# Patient Record
Sex: Female | Born: 1995 | Race: White | Hispanic: No | Smoking: Former smoker
Health system: Southern US, Community
[De-identification: ages and names within clinical notes are randomized; demographics above are authoritative.]

## PROBLEM LIST (undated history)

## (undated) DIAGNOSIS — Z8049 Family history of malignant neoplasm of other genital organs: Secondary | ICD-10-CM

## (undated) DIAGNOSIS — E785 Hyperlipidemia, unspecified: Secondary | ICD-10-CM

## (undated) DIAGNOSIS — F419 Anxiety disorder, unspecified: Secondary | ICD-10-CM

## (undated) DIAGNOSIS — D649 Anemia, unspecified: Secondary | ICD-10-CM

## (undated) DIAGNOSIS — N946 Dysmenorrhea, unspecified: Secondary | ICD-10-CM

## (undated) DIAGNOSIS — F32A Depression, unspecified: Secondary | ICD-10-CM

## (undated) DIAGNOSIS — B009 Herpesviral infection, unspecified: Secondary | ICD-10-CM

## (undated) DIAGNOSIS — Z803 Family history of malignant neoplasm of breast: Secondary | ICD-10-CM

## (undated) HISTORY — DX: Dysmenorrhea, unspecified: N94.6

## (undated) HISTORY — DX: Depression, unspecified: F32.A

## (undated) HISTORY — DX: Family history of malignant neoplasm of other genital organs: Z80.49

## (undated) HISTORY — DX: Herpesviral infection, unspecified: B00.9

## (undated) HISTORY — PX: TYMPANOSTOMY TUBE PLACEMENT: SHX32

## (undated) HISTORY — DX: Family history of malignant neoplasm of breast: Z80.3

## (undated) HISTORY — DX: Anemia, unspecified: D64.9

## (undated) HISTORY — DX: Hyperlipidemia, unspecified: E78.5

## (undated) HISTORY — DX: Anxiety disorder, unspecified: F41.9

---
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
## 2013-01-02 ENCOUNTER — Ambulatory Visit: Payer: Managed Care, Other (non HMO) | Admitting: Gynecology

## 2013-01-06 ENCOUNTER — Encounter: Payer: Self-pay | Admitting: Gynecology

## 2013-01-06 ENCOUNTER — Ambulatory Visit (INDEPENDENT_AMBULATORY_CARE_PROVIDER_SITE_OTHER): Payer: Managed Care, Other (non HMO) | Admitting: Gynecology

## 2013-01-06 VITALS — BP 100/66 | HR 78 | Resp 12 | Ht 68.5 in | Wt 180.0 lb

## 2013-01-06 DIAGNOSIS — N926 Irregular menstruation, unspecified: Secondary | ICD-10-CM

## 2013-01-06 DIAGNOSIS — Z309 Encounter for contraceptive management, unspecified: Secondary | ICD-10-CM

## 2013-01-06 NOTE — Progress Notes (Signed)
Subjective:     Patient ID: Karen Goodman, female   DOB: 13-Aug-1995, 17 y.o.   MRN: [id number]  HPI Comments: Pt here to follow up contraception using Lomedia-first 2 cycles very light, cramping better but still with some nausea, might be getting better.  Last cycle came on active pills mid-cycle. Started on time, continued to take the pack, no cycle on sugar pill week.  Pt did not do pregnancy test.  Denies any other medications, did not miss or was late, this cycle lasted 4-5d compared with 3d on other cycles.   No mastalgia.    Review of Systems  Genitourinary: Positive for vaginal bleeding (as above). Negative for vaginal pain and pelvic pain.  All other systems reviewed and are negative.       Objective:   Physical Exam  Nursing note and vitals reviewed. Constitutional: She appears well-developed and well-nourished.  Neurological: She is alert.       Assessment:     Dysmenorrhea  contraceptive management     Plan:     Will check qual hcg Pt would like to continue of current ocp if preg test negative, asked to call back after 2 mor cycles if bleeding remains irregular, will change to higher estrogen

## 2013-01-07 LAB — HCG, QUANTITATIVE, PREGNANCY: hCG, Beta Chain, Quant, S: <2 m[IU]/mL

## 2013-03-13 ENCOUNTER — Telehealth: Payer: Self-pay | Admitting: Gynecology

## 2013-03-13 NOTE — Telephone Encounter (Signed)
Spoke with pt who is on Lomedia 24 FE. Pt wanted to update Dr. Farrel Gobble that she is doing fine on med. No BTB or side effects. Cycles regular, 25-28 days long, and they are lighter now.

## 2013-03-13 NOTE — Telephone Encounter (Signed)
Chief Complaint  Patient presents with   Medication Management  pt wanted to give dr lathrop an update on how she is doing on her birth control. Says she is doing fine.

## 2013-03-16 ENCOUNTER — Other Ambulatory Visit: Payer: Self-pay

## 2013-07-26 ENCOUNTER — Telehealth: Payer: Self-pay | Admitting: Gynecology

## 2013-07-26 NOTE — Telephone Encounter (Signed)
Dr. Farrel GobbleLathrop, patient request IUD. Can she schedule or does need an office visit first to discuss?

## 2013-07-26 NOTE — Telephone Encounter (Signed)
Patient calling to wanting to schedule IUD placement with Dr. Farrel GobbleLathrop. She says she has been thinking about it and wants to proceed.  Cc: Karen Goodman

## 2013-07-27 NOTE — Telephone Encounter (Signed)
Patient is calling again about the IUD

## 2013-07-27 NOTE — Telephone Encounter (Signed)
Spoke with patient. Patient agreeable to schedule appointment to discuss IUD placement with Dr.Lathrop. Appointment scheduled with Dr.Lathrop for 3/30 at 1830 to discuss IUD placement.  Routing to provider for final review. Patient agreeable to disposition. Will close encounter

## 2013-07-28 NOTE — Telephone Encounter (Signed)
Insurance information currently on file for this patient is no longer valid.

## 2013-08-07 ENCOUNTER — Encounter: Payer: Self-pay | Admitting: Gynecology

## 2013-08-07 ENCOUNTER — Ambulatory Visit (INDEPENDENT_AMBULATORY_CARE_PROVIDER_SITE_OTHER): Payer: 59 | Admitting: Gynecology

## 2013-08-07 VITALS — BP 124/76 | HR 72 | Resp 16 | Ht 68.5 in | Wt 206.0 lb

## 2013-08-07 DIAGNOSIS — R635 Abnormal weight gain: Secondary | ICD-10-CM

## 2013-08-07 DIAGNOSIS — Z309 Encounter for contraceptive management, unspecified: Secondary | ICD-10-CM

## 2013-08-07 NOTE — Patient Instructions (Signed)
Etonogestrel implant What is this medicine? ETONOGESTREL (et oh noe JES trel) is a contraceptive (birth control) device. It is used to prevent pregnancy. It can be used for up to 3 years. This medicine may be used for other purposes; ask your health care provider or pharmacist if you have questions. COMMON BRAND NAME(S): Implanon, Nexplanon  What should I tell my health care provider before I take this medicine? They need to know if you have any of these conditions: -abnormal vaginal bleeding -blood vessel disease or blood clots -cancer of the breast, cervix, or liver -depression -diabetes -gallbladder disease -headaches -heart disease or recent heart attack -high blood pressure -high cholesterol -kidney disease -liver disease -renal disease -seizures -tobacco smoker -an unusual or allergic reaction to etonogestrel, other hormones, anesthetics or antiseptics, medicines, foods, dyes, or preservatives -pregnant or trying to get pregnant -breast-feeding How should I use this medicine? This device is inserted just under the skin on the inner side of your upper arm by a health care professional. Talk to your pediatrician regarding the use of this medicine in children. Special care may be needed. Overdosage: If you think you've taken too much of this medicine contact a poison control center or emergency room at once. Overdosage: If you think you have taken too much of this medicine contact a poison control center or emergency room at once. NOTE: This medicine is only for you. Do not share this medicine with others. What if I miss a dose? This does not apply. What may interact with this medicine? Do not take this medicine with any of the following medications: -amprenavir -bosentan -fosamprenavir This medicine may also interact with the following medications: -barbiturate medicines for inducing sleep or treating seizures -certain medicines for fungal infections like ketoconazole and  itraconazole -griseofulvin -medicines to treat seizures like carbamazepine, felbamate, oxcarbazepine, phenytoin, topiramate -modafinil -phenylbutazone -rifampin -some medicines to treat HIV infection like atazanavir, indinavir, lopinavir, nelfinavir, tipranavir, ritonavir -St. John's wort This list may not describe all possible interactions. Give your health care provider a list of all the medicines, herbs, non-prescription drugs, or dietary supplements you use. Also tell them if you smoke, drink alcohol, or use illegal drugs. Some items may interact with your medicine. What should I watch for while using this medicine? This product does not protect you against HIV infection (AIDS) or other sexually transmitted diseases. You should be able to feel the implant by pressing your fingertips over the skin where it was inserted. Tell your doctor if you cannot feel the implant. What side effects may I notice from receiving this medicine? Side effects that you should report to your doctor or health care professional as soon as possible: -allergic reactions like skin rash, itching or hives, swelling of the face, lips, or tongue -breast lumps -changes in vision -confusion, trouble speaking or understanding -dark urine -depressed mood -general ill feeling or flu-like symptoms -light-colored stools -loss of appetite, nausea -right upper belly pain -severe headaches -severe pain, swelling, or tenderness in the abdomen -shortness of breath, chest pain, swelling in a leg -signs of pregnancy -sudden numbness or weakness of the face, arm or leg -trouble walking, dizziness, loss of balance or coordination -unusual vaginal bleeding, discharge -unusually weak or tired -yellowing of the eyes or skin Side effects that usually do not require medical attention (Report these to your doctor or health care professional if they continue or are bothersome.): -acne -breast pain -changes in  weight -cough -fever or chills -headache -irregular menstrual bleeding -itching, burning,   and vaginal discharge -pain or difficulty passing urine -sore throat This list may not describe all possible side effects. Call your doctor for medical advice about side effects. You may report side effects to FDA at 1-800-FDA-1088. Where should I keep my medicine? This drug is given in a hospital or clinic and will not be stored at home. NOTE: This sheet is a summary. It may not cover all possible information. If you have questions about this medicine, talk to your doctor, pharmacist, or health care provider.  2014, Elsevier/Gold Standard. (2011-11-02 15:37:45) Levonorgestrel intrauterine device (IUD) What is this medicine? LEVONORGESTREL IUD (LEE voe nor jes trel) is a contraceptive (birth control) device. The device is placed inside the uterus by a healthcare professional. It is used to prevent pregnancy and can also be used to treat heavy bleeding that occurs during your period. Depending on the device, it can be used for 3 to 5 years. This medicine may be used for other purposes; ask your health care provider or pharmacist if you have questions. COMMON BRAND NAME(S): Mirena, Skyla What should I tell my health care provider before I take this medicine? They need to know if you have any of these conditions: -abnormal Pap smear -cancer of the breast, uterus, or cervix -diabetes -endometritis -genital or pelvic infection now or in the past -have more than one sexual partner or your partner has more than one partner -heart disease -history of an ectopic or tubal pregnancy -immune system problems -IUD in place -liver disease or tumor -problems with blood clots or take blood-thinners -use intravenous drugs -uterus of unusual shape -vaginal bleeding that has not been explained -an unusual or allergic reaction to levonorgestrel, other hormones, silicone, or polyethylene, medicines, foods, dyes,  or preservatives -pregnant or trying to get pregnant -breast-feeding How should I use this medicine? This device is placed inside the uterus by a health care professional. Talk to your pediatrician regarding the use of this medicine in children. Special care may be needed. Overdosage: If you think you have taken too much of this medicine contact a poison control center or emergency room at once. NOTE: This medicine is only for you. Do not share this medicine with others. What if I miss a dose? This does not apply. What may interact with this medicine? Do not take this medicine with any of the following medications: -amprenavir -bosentan -fosamprenavir This medicine may also interact with the following medications: -aprepitant -barbiturate medicines for inducing sleep or treating seizures -bexarotene -griseofulvin -medicines to treat seizures like carbamazepine, ethotoin, felbamate, oxcarbazepine, phenytoin, topiramate -modafinil -pioglitazone -rifabutin -rifampin -rifapentine -some medicines to treat HIV infection like atazanavir, indinavir, lopinavir, nelfinavir, tipranavir, ritonavir -St. John's wort -warfarin This list may not describe all possible interactions. Give your health care provider a list of all the medicines, herbs, non-prescription drugs, or dietary supplements you use. Also tell them if you smoke, drink alcohol, or use illegal drugs. Some items may interact with your medicine. What should I watch for while using this medicine? Visit your doctor or health care professional for regular check ups. See your doctor if you or your partner has sexual contact with others, becomes HIV positive, or gets a sexual transmitted disease. This product does not protect you against HIV infection (AIDS) or other sexually transmitted diseases. You can check the placement of the IUD yourself by reaching up to the top of your vagina with clean fingers to feel the threads. Do not pull on  the threads. It is a good   habit to check placement after each menstrual period. Call your doctor right away if you feel more of the IUD than just the threads or if you cannot feel the threads at all. The IUD may come out by itself. You may become pregnant if the device comes out. If you notice that the IUD has come out use a backup birth control method like condoms and call your health care provider. Using tampons will not change the position of the IUD and are okay to use during your period. What side effects may I notice from receiving this medicine? Side effects that you should report to your doctor or health care professional as soon as possible: -allergic reactions like skin rash, itching or hives, swelling of the face, lips, or tongue -fever, flu-like symptoms -genital sores -high blood pressure -no menstrual period for 6 weeks during use -pain, swelling, warmth in the leg -pelvic pain or tenderness -severe or sudden headache -signs of pregnancy -stomach cramping -sudden shortness of breath -trouble with balance, talking, or walking -unusual vaginal bleeding, discharge -yellowing of the eyes or skin Side effects that usually do not require medical attention (report to your doctor or health care professional if they continue or are bothersome): -acne -breast pain -change in sex drive or performance -changes in weight -cramping, dizziness, or faintness while the device is being inserted -headache -irregular menstrual bleeding within first 3 to 6 months of use -nausea This list may not describe all possible side effects. Call your doctor for medical advice about side effects. You may report side effects to FDA at 1-800-FDA-1088. Where should I keep my medicine? This does not apply. NOTE: This sheet is a summary. It may not cover all possible information. If you have questions about this medicine, talk to your doctor, pharmacist, or health care provider.  2014, Elsevier/Gold  Standard. (2011-05-28 13:54:04)  

## 2013-08-07 NOTE — Progress Notes (Addendum)
Pt here to discuss alternative contraceptive options.  Pt has been on LoMedia since 5/14, reports that her cycles have been light and regular but states that she has gained 40#on it.  Pt states that she has not had a change in diet or activity and she was interested in different contraceptive options.   Pt had negative GC/CTM in 5/14 but has had several partners since then and has not been consistent with condom use. Pt has not had sex in over 704m We discussed her options.   We reviewed the risks and benefits on f both the skyla IUD and nexplanon.  Risks of bleeding, infection and uterine perforation we discussed.  Risks of pelvic infection that may result in infertility with history of multiple partners reviewed.  Bleeding profile of both reviewed. MOA for PID reviewed Suggest she return for STD screening and cultures and she is agreeable-lab is closed. Information given  We will check TSH as well as amount of weight is unusual.  Pt will stay on her ocp until contraceptive choice made. Questions addressed. 3792m spent discussing contraceptive choices, >50% face to face  08/08/13- Pt here for pelvic exam and labs.   Pelvic: External genitalia:  no lesions              Urethra:  normal appearing urethra with no masses, tenderness or lesions              Bartholins and Skenes: normal                 Vagina: normal appearing vagina with normal color and discharge, no lesions              Cervix: normal appearance, friable with GC/CTM cultures                 Bimanual Exam:  Uterus:  uterus is normal size, shape, consistency and nontender                                      Adnexa: normal adnexa in size, nontender and no masses                                        GC/CTM, HIV,RPR AND HCV today Will contact with results

## 2013-08-08 ENCOUNTER — Other Ambulatory Visit: Payer: 59

## 2013-08-08 ENCOUNTER — Ambulatory Visit (INDEPENDENT_AMBULATORY_CARE_PROVIDER_SITE_OTHER): Payer: 59 | Admitting: Gynecology

## 2013-08-08 VITALS — BP 118/76 | Resp 20 | Ht 69.5 in | Wt 205.0 lb

## 2013-08-08 DIAGNOSIS — R635 Abnormal weight gain: Secondary | ICD-10-CM

## 2013-08-08 DIAGNOSIS — Z309 Encounter for contraceptive management, unspecified: Secondary | ICD-10-CM

## 2013-08-08 LAB — HIV ANTIBODY (ROUTINE TESTING W REFLEX): HIV: NONREACTIVE

## 2013-08-08 LAB — TSH: TSH: 0.561 u[IU]/mL (ref 0.350–4.500)

## 2013-08-08 LAB — RPR

## 2013-08-08 NOTE — Addendum Note (Signed)
Addended by: Douglass RiversLATHROP, Lorenza Shakir on: 08/08/2013 02:25 PM   Modules accepted: Level of Service

## 2013-08-09 NOTE — Addendum Note (Signed)
Addended by: Douglass RiversLATHROP, Hilari Wethington on: 08/09/2013 02:14 PM   Modules accepted: Orders

## 2013-08-09 NOTE — Progress Notes (Signed)
Labs only see visit 3/30

## 2013-08-11 LAB — IPS N GONORRHOEA AND CHLAMYDIA BY PCR

## 2013-08-14 ENCOUNTER — Telehealth: Payer: Self-pay | Admitting: *Deleted

## 2013-08-14 NOTE — Telephone Encounter (Signed)
Left Message To Call Back  

## 2013-08-14 NOTE — Telephone Encounter (Signed)
Message copied by Lorraine LaxSHAW, Cina Klumpp J on Mon Aug 14, 2013 12:27 PM ------      Message from: Douglass RiversLATHROP, TRACY      Created: Mon Aug 14, 2013 11:20 AM       GC/CTM negative! Sent mychart ------

## 2013-08-14 NOTE — Telephone Encounter (Signed)
Patient returning Jasmine's call.  

## 2013-08-14 NOTE — Telephone Encounter (Signed)
Patient notified see result note 

## 2013-08-15 ENCOUNTER — Telehealth: Payer: Self-pay | Admitting: Gynecology

## 2013-08-15 DIAGNOSIS — Z309 Encounter for contraceptive management, unspecified: Secondary | ICD-10-CM

## 2013-08-15 NOTE — Telephone Encounter (Signed)
Patient is ready to schedule IUD insertion. °

## 2013-08-15 NOTE — Telephone Encounter (Signed)
Spoke with patient. Patient has decided that [facility]. Advised patient that per her insurance benefit quote received, this will be covered at 100% with zero patient liability. Advised patient to call within the first 5 days of cycle to schedule the insertion. Patient agreeable.  French Anaracy, Please enter the iud order. Thanks!!

## 2013-08-15 NOTE — Telephone Encounter (Signed)
IUD order entered. Will schedule when patient calls back on first day of cycle.  Routing to provider for final review. Patient agreeable to disposition. Will close encounter

## 2013-08-22 ENCOUNTER — Telehealth: Payer: Self-pay | Admitting: Gynecology

## 2013-08-22 MED ORDER — MISOPROSTOL 200 MCG PO TABS: ORAL_TABLET | ORAL | Status: DC

## 2013-08-22 NOTE — Telephone Encounter (Signed)
Spoke with patient. Patient states that she started menses today and would like to schedule IUD insertion. Pre procedure instructions given.  Motrin instructions given. Motrin=Advil=Ibuprofen, 800 mg one hour before appointment. Eat a meal and hydrate well before appointment. Take Cytotec 200 mcg tablet. 1 tablet night before the procedure. 1 tablet the morning of the procedure. Patient aware of benefit coverage.Appointment made for tomorrow at 3:30 with Dr.Lathrop.   Routing to provider for final review. Patient agreeable to disposition. Will close encounter

## 2013-08-22 NOTE — Telephone Encounter (Signed)
Pt's period started today and would like to schedule iud procedure.

## 2013-08-23 ENCOUNTER — Ambulatory Visit (INDEPENDENT_AMBULATORY_CARE_PROVIDER_SITE_OTHER): Payer: 59 | Admitting: Gynecology

## 2013-08-23 ENCOUNTER — Encounter: Payer: Self-pay | Admitting: Gynecology

## 2013-08-23 VITALS — BP 108/76 | Resp 12 | Ht 69.5 in | Wt 206.0 lb

## 2013-08-23 DIAGNOSIS — Z30430 Encounter for insertion of intrauterine contraceptive device: Secondary | ICD-10-CM

## 2013-08-23 DIAGNOSIS — Z975 Presence of (intrauterine) contraceptive device: Secondary | ICD-10-CM

## 2013-08-23 DIAGNOSIS — Z309 Encounter for contraceptive management, unspecified: Secondary | ICD-10-CM

## 2013-08-23 NOTE — Patient Instructions (Signed)
IUD PLACEMENT POST PROCEDURE INSTRUCTIONS  1. You may take Ibuprofen, Aleve or Tylenol for pain as needed.      Cramping should resolve within 24 hours.  2. You may have a small amount of spotting. You should wear a mini pad for the next few days  3. You may have intercourse after 24 hours. If you are using this for birth control, it is effective immediately.  4. You need to call if you have any pelvic pain, fever, heavy bleeding or foul smelling vaginal discharge. Irregular bleeding is common the first several months after having an IUD placed. You do not need to for this reason unless you are                     concerned.   5. Shower or bathe as normal  6. You should have a follow-up appointment in 4-8 weeks for a re-check to make sure you are not having any problems.  

## 2013-08-23 NOTE — Progress Notes (Signed)
[age] yrsSingle Caucasian female presents for  insertion of SKYLA. Denies any vaginal symptoms or STD concerns.  LMP 08/22/13  Patient read information regarding IUD insertion.  All questions addressed.    Healthy female,time, place and personnormal menses, no abnormal bleeding, pelvic pain or discharge, no breast pain or new or enlarging lumps on self exam Abdomen: soft, non-tender Groinno inguinal nodes palpated  Pelvic exam: Vulva;normal female genitalia  Vagina:normal vagina  Cervix:Non-tender, Negative CMT, no lesions or redness, nulliparous/parous os  Uterus:normal shape, position and consistency   Lab:no pathogens  Procedure:  Speculum inserted into vagina. Cervix visualized and cleansed with betadine solution X 3. Xylocaine jelly 2% placed anteriorly and endocervix Tenaculum placed on cervix at 12 o'clock position(s).  Uterus sounded to 7 centimeters.  IUD removed from sterile packet and under sterile conditions inserted to fundus of uterus.  Introducer removed without difficulty.  IUD string trimmed to 3 centimeters.  Remainder string given to patient to feel for identification.  Tenaculum removed.  Some bleeding noted.  Speculum removed.  Uterus palpated normal.  Patient tolerated procedure well.  A: Insertion of Lot # [id number], Expiration date 1/17, SKYLA   P:  Instructions and warnings signs given.       IUD identification card given with IUD removal 08/2016       Return visit 47M

## 2013-09-22 ENCOUNTER — Encounter: Payer: Self-pay | Admitting: Gynecology

## 2013-09-22 ENCOUNTER — Ambulatory Visit (INDEPENDENT_AMBULATORY_CARE_PROVIDER_SITE_OTHER): Payer: 59 | Admitting: Gynecology

## 2013-09-22 VITALS — BP 100/60 | HR 88 | Resp 18 | Ht 69.5 in | Wt 202.0 lb

## 2013-09-22 DIAGNOSIS — Z30431 Encounter for routine checking of intrauterine contraceptive device: Secondary | ICD-10-CM

## 2013-09-22 DIAGNOSIS — Z975 Presence of (intrauterine) contraceptive device: Secondary | ICD-10-CM

## 2013-09-22 NOTE — Progress Notes (Signed)
Pt here for 3068m IUD check for skyla, no dyspareunia, no bleeding issues.  Very happy.  Pt was concerned re weight gain, she has lost 33 this month and is feeling less bloated.  BP 100/60  Pulse 88  Resp 18  Ht 5' 9.5" (1.765 m)  Wt 202 lb (91.627 kg)  BMI 29.41 kg/m2  LMP 08/22/2013 General appearance: alert, cooperative and appears stated age  Pelvic: External genitalia:  no lesions              Urethra:  normal appearing urethra with no masses, tenderness or lesions              Bartholins and Skenes: normal                 Vagina: normal appearing vagina with normal color and discharge, no lesions              Cervix: normal appearance, IUD strings noted, no CMT                     Bimanual Exam:  Uterus:  uterus is normal size, shape, consistency and nontender                                      Adnexa: normal adnexa in size, nontender and no masses                                        Assessment: IUD check up  Plan; Doing well F/u prn

## 2013-09-25 ENCOUNTER — Ambulatory Visit: Payer: 59 | Admitting: Gynecology

## 2016-06-22 DIAGNOSIS — G5600 Carpal tunnel syndrome, unspecified upper limb: Secondary | ICD-10-CM | POA: Diagnosis not present

## 2017-09-23 DIAGNOSIS — J0300 Acute streptococcal tonsillitis, unspecified: Secondary | ICD-10-CM | POA: Diagnosis not present

## 2019-10-23 ENCOUNTER — Encounter: Payer: Self-pay | Admitting: Obstetrics and Gynecology

## 2019-10-27 DIAGNOSIS — Z975 Presence of (intrauterine) contraceptive device: Secondary | ICD-10-CM | POA: Diagnosis not present

## 2019-10-27 DIAGNOSIS — N939 Abnormal uterine and vaginal bleeding, unspecified: Secondary | ICD-10-CM | POA: Diagnosis not present

## 2019-11-20 ENCOUNTER — Telehealth: Payer: Self-pay | Admitting: Obstetrics and Gynecology

## 2019-11-20 ENCOUNTER — Ambulatory Visit: Payer: BC Managed Care – PPO | Admitting: Obstetrics and Gynecology

## 2019-11-20 ENCOUNTER — Other Ambulatory Visit (HOSPITAL_COMMUNITY)
Admission: RE | Admit: 2019-11-20 | Discharge: 2019-11-20 | Disposition: A | Discharge status: Home / Self Care | Payer: Self-pay | Source: Ambulatory Visit | Attending: Obstetrics and Gynecology | Admitting: Obstetrics and Gynecology

## 2019-11-20 ENCOUNTER — Other Ambulatory Visit: Payer: Self-pay

## 2019-11-20 ENCOUNTER — Encounter: Payer: Self-pay | Admitting: Obstetrics and Gynecology

## 2019-11-20 ENCOUNTER — Other Ambulatory Visit: Payer: Self-pay | Admitting: Obstetrics and Gynecology

## 2019-11-20 VITALS — BP 110/72 | HR 80 | Resp 14 | Ht 68.0 in | Wt 215.0 lb

## 2019-11-20 DIAGNOSIS — Z113 Encounter for screening for infections with a predominantly sexual mode of transmission: Secondary | ICD-10-CM | POA: Diagnosis not present

## 2019-11-20 DIAGNOSIS — N939 Abnormal uterine and vaginal bleeding, unspecified: Secondary | ICD-10-CM | POA: Diagnosis not present

## 2019-11-20 DIAGNOSIS — Z803 Family history of malignant neoplasm of breast: Secondary | ICD-10-CM

## 2019-11-20 DIAGNOSIS — Z01419 Encounter for gynecological examination (general) (routine) without abnormal findings: Secondary | ICD-10-CM | POA: Diagnosis not present

## 2019-11-20 NOTE — Patient Instructions (Signed)
EXERCISE AND DIET:  We recommended that you start or continue a regular exercise program for good health. Regular exercise means any activity that makes your heart beat faster and makes you sweat.  We recommend exercising at least 30 minutes per day at least 3 days a week, preferably 4 or 5.  We also recommend a diet low in fat and sugar.  Inactivity, poor dietary choices and obesity can cause diabetes, heart attack, stroke, and kidney damage, among others.    ALCOHOL AND SMOKING:  Women should limit their alcohol intake to no more than 7 drinks/beers/glasses of wine (combined, not each!) per week. Moderation of alcohol intake to this level decreases your risk of breast cancer and liver damage. And of course, no recreational drugs are part of a healthy lifestyle.  And absolutely no smoking or even second hand smoke. Most people know smoking can cause heart and lung diseases, but did you know it also contributes to weakening of your bones? Aging of your skin?  Yellowing of your teeth and nails?  CALCIUM AND VITAMIN D:  Adequate intake of calcium and Vitamin D are recommended.  The recommendations for exact amounts of these supplements seem to change often, but generally speaking 600 mg of calcium (either carbonate or citrate) and 800 units of Vitamin D per day seems prudent. Certain women may benefit from higher intake of Vitamin D.  If you are among these women, your doctor will have told you during your visit.    PAP SMEARS:  Pap smears, to check for cervical cancer or precancers,  have traditionally been done yearly, although recent scientific advances have shown that most women can have pap smears less often.  However, every woman still should have a physical exam from her gynecologist every year. It will include a breast check, inspection of the vulva and vagina to check for abnormal growths or skin changes, a visual exam of the cervix, and then an exam to evaluate the size and shape of the uterus and  ovaries.  And after 26 years of age, a rectal exam is indicated to check for rectal cancers. We will also provide age appropriate advice regarding health maintenance, like when you should have certain vaccines, screening for sexually transmitted diseases, bone density testing, colonoscopy, mammograms, etc.   MAMMOGRAMS:  All women over 40 years old should have a yearly mammogram. Many facilities now offer a "3D" mammogram, which may cost around $50 extra out of pocket. If possible,  we recommend you accept the option to have the 3D mammogram performed.  It both reduces the number of women who will be called back for extra views which then turn out to be normal, and it is better than the routine mammogram at detecting truly abnormal areas.    COLONOSCOPY:  Colonoscopy to screen for colon cancer is recommended for all women at age 50.  We know, you hate the idea of the prep.  We agree, BUT, having colon cancer and not knowing it is worse!!  Colon cancer so often starts as a polyp that can be seen and removed at colonscopy, which can quite literally save your life!  And if your first colonoscopy is normal and you have no family history of colon cancer, most women don't have to have it again for 10 years.  Once every ten years, you can do something that may end up saving your life, right?  We will be happy to help you get it scheduled when you are ready.    Be sure to check your insurance coverage so you understand how much it will cost.  It may be covered as a preventative service at no cost, but you should check your particular policy.     Levonorgestrel intrauterine device (IUD) What is this medicine? LEVONORGESTREL IUD (LEE voe nor jes trel) is a contraceptive (birth control) device. The device is placed inside the uterus by a healthcare professional. It is used to prevent pregnancy. This device can also be used to treat heavy bleeding that occurs during your period. This medicine may be used for other  purposes; ask your health care provider or pharmacist if you have questions. COMMON BRAND NAME(S): [name], LILETTA, Mirena, Skyla What should I tell my health care provider before I take this medicine? They need to know if you have any of these conditions:  abnormal Pap smear  cancer of the breast, uterus, or cervix  diabetes  endometritis  genital or pelvic infection now or in the past  have more than one sexual partner or your partner has more than one partner  heart disease  history of an ectopic or tubal pregnancy  immune system problems  IUD in place  liver disease or tumor  problems with blood clots or take blood-thinners  seizures  use intravenous drugs  uterus of unusual shape  vaginal bleeding that has not been explained  an unusual or allergic reaction to levonorgestrel, other hormones, silicone, or polyethylene, medicines, foods, dyes, or preservatives  pregnant or trying to get pregnant  breast-feeding How should I use this medicine? This device is placed inside the uterus by a health care professional. Talk to your pediatrician regarding the use of this medicine in children. Special care may be needed. Overdosage: If you think you have taken too much of this medicine contact a poison control center or emergency room at once. NOTE: This medicine is only for you. Do not share this medicine with others. What if I miss a dose? This does not apply. Depending on the brand of device you have inserted, the device will need to be replaced every 3 to 6 years if you wish to continue using this type of birth control. What may interact with this medicine? Do not take this medicine with any of the following medications:  amprenavir  bosentan  fosamprenavir This medicine may also interact with the following medications:  aprepitant  armodafinil  barbiturate medicines for inducing sleep or treating  seizures  bexarotene  boceprevir  griseofulvin  medicines to treat seizures like carbamazepine, ethotoin, felbamate, oxcarbazepine, phenytoin, topiramate  modafinil  pioglitazone  rifabutin  rifampin  rifapentine  some medicines to treat HIV infection like atazanavir, efavirenz, indinavir, lopinavir, nelfinavir, tipranavir, ritonavir  St. John's wort  warfarin This list may not describe all possible interactions. Give your health care provider a list of all the medicines, herbs, non-prescription drugs, or dietary supplements you use. Also tell them if you smoke, drink alcohol, or use illegal drugs. Some items may interact with your medicine. What should I watch for while using this medicine? Visit your doctor or health care professional for regular check ups. See your doctor if you or your partner has sexual contact with others, becomes HIV positive, or gets a sexual transmitted disease. This product does not protect you against HIV infection (AIDS) or other sexually transmitted diseases. You can check the placement of the IUD yourself by reaching up to the top of your vagina with clean fingers to feel the threads. Do not pull   on the threads. It is a good habit to check placement after each menstrual period. Call your doctor right away if you feel more of the IUD than just the threads or if you cannot feel the threads at all. The IUD may come out by itself. You may become pregnant if the device comes out. If you notice that the IUD has come out use a backup birth control method like condoms and call your health care provider. Using tampons will not change the position of the IUD and are okay to use during your period. This IUD can be safely scanned with magnetic resonance imaging (MRI) only under specific conditions. Before you have an MRI, tell your healthcare provider that you have an IUD in place, and which type of IUD you have in place. What side effects may I notice from  receiving this medicine? Side effects that you should report to your doctor or health care professional as soon as possible:  allergic reactions like skin rash, itching or hives, swelling of the face, lips, or tongue  fever, flu-like symptoms  genital sores  high blood pressure  no menstrual period for 6 weeks during use  pain, swelling, warmth in the leg  pelvic pain or tenderness  severe or sudden headache  signs of pregnancy  stomach cramping  sudden shortness of breath  trouble with balance, talking, or walking  unusual vaginal bleeding, discharge  yellowing of the eyes or skin Side effects that usually do not require medical attention (report to your doctor or health care professional if they continue or are bothersome):  acne  breast pain  change in sex drive or performance  changes in weight  cramping, dizziness, or faintness while the device is being inserted  headache  irregular menstrual bleeding within first 3 to 6 months of use  nausea This list may not describe all possible side effects. Call your doctor for medical advice about side effects. You may report side effects to FDA at 1-800-FDA-1088. Where should I keep my medicine? This does not apply. NOTE: This sheet is a summary. It may not cover all possible information. If you have questions about this medicine, talk to your doctor, pharmacist, or health care provider.  2020 Elsevier/Gold Standard (2018-03-08 13:22:01)  

## 2019-11-20 NOTE — Progress Notes (Signed)
24 y.o. G0P0000 Single Caucasian female here for annual exam.    Patient with Christean Grief IUD and had been having bleeding since 10-23-19. She has not been having cycles with IUD since insertion until now. June 16, she woke up with heavy bleeding, which lasted 2 days. No pain.  States she feels ok.  She had a pregnancy test on 10/29/19, and she states it was negative.  She had a gonorrhea and chlamydia testing which were negative.   Would like another progesterone IUD.   Working at [employer] in [place] since Sept, 2020.  Has been vaccinated against Covid.   Has some anxiety with office visits.  PCP:  Deboraha Sprang  Patient's last menstrual period was 10/23/2019 (exact date).     Period Cycle (Days):  (Skyla IUD)     Sexually active: Yes.    The current method of family planning is IUD--Skyla placed 08/23/13. Exercising: No.  The patient does not participate in regular exercise at present. Smoker:  Yes, smokes 1/4ppd  Health Maintenance: Pap: ?Never History of abnormal Pap:  N/an/a MMG:  N/a--mom dx'd with Breast ca age 43 y.o. Colonoscopy:  n/a BMD:   n/a  Result  n/a TDaP:  n/a Gardasil:   yes HIV:09-20-12 NR Hep C:09-20-12 Neg Screening Labs:  ---   reports that she has been smoking cigarettes. She has a 1.00 pack-year smoking history. She uses smokeless tobacco. She reports current alcohol use of about 21.0 standard drinks of alcohol per week. She reports current drug use. Drug: Marijuana.  Past Medical History:  Diagnosis Date   Anemia    Anxiety    Tx'd at age 30 and gradually weaned off med   Depression    tx'd at age 58 and eventually weaned off medication   Dysmenorrhea     Past Surgical History:  Procedure Laterality Date   TYMPANOSTOMY TUBE PLACEMENT     When she was a baby   WISDOM TOOTH EXTRACTION  2011    Current Outpatient Medications  Medication Sig Dispense Refill   EPIPEN 2-PAK 0.3 MG/0.3ML SOAJ 0.3 mg.     Levonorgestrel (SKYLA) 13.5 MG IUD by  Intrauterine route.     No current facility-administered medications for this visit.    Family History  Problem Relation Age of Onset   Breast cancer Mother 59       stage 1, ductal, nonfiltrating-chemo, lumpectomy   Infertility Maternal Aunt    Breast cancer Maternal Aunt 44       ductal, dbl mastectomy-no further rx   Infertility Maternal Grandmother    Cancer Maternal Grandfather        lung-smoker    Review of Systems  All other systems reviewed and are negative.   Exam:   BP 110/72 (Cuff Size: Large)    Pulse 80    Resp 14    Ht 5\' 8"  (1.727 m)    Wt 215 lb (97.5 kg)    LMP 10/23/2019 (Exact Date)    BMI 32.69 kg/m     General appearance: alert, cooperative and appears stated age Head: normocephalic, without obvious abnormality, atraumatic Neck: no adenopathy, supple, symmetrical, trachea midline and thyroid normal to inspection and palpation Lungs: clear to auscultation bilaterally Breasts: normal appearance, no masses or tenderness, No nipple retraction or dimpling, No nipple discharge or bleeding, No axillary adenopathy Heart: regular rate and rhythm Abdomen: soft, non-tender; no masses, no organomegaly Extremities: extremities normal, atraumatic, no cyanosis or edema Skin: skin color, texture, turgor normal. No rashes  or lesions Lymph nodes: cervical, supraclavicular, and axillary nodes normal. Neurologic: grossly normal  Pelvic: External genitalia:  no lesions              No abnormal inguinal nodes palpated.              Urethra:  normal appearing urethra with no masses, tenderness or lesions              Bartholins and Skenes: normal                 Vagina: normal appearing vagina with normal color and discharge, no lesions              Cervix: no lesions.  IUD strings noted.  Small blood clot.              Pap taken: Yes.   Bimanual Exam:  Uterus:  normal size, contour, position, consistency, mobility, non-tender              Adnexa: no mass, fullness,  tenderness            Chaperone was present for exam.  Assessment:   Well woman visit with normal exam. Expired Skyla IUD.  FH breast early onset breast cancer.  Smoking.   Plan: Mammogram screening to be scheduled. Self breast awareness reviewed. Referral for genetic counseling and testing.  Pap and HR HPV as above.   Trichomonas testing from pap.  Serum STD screening, CBC, TSH, quant hCG.   Guidelines for Calcium, Vitamin D, regular exercise program including cardiovascular and weight bearing exercise. Declines smoking cessation.  Return for IUD removal.  If bleeding resolves, return for new IUD placement.  She declines a pelvic US and prefers to have the IUD removed to see if bleeding resolves prior to placing a new IUD.  Plan for new IUD with Palau or Mirena.  Follow up annually and prn.   After visit summary provided.

## 2019-11-20 NOTE — Telephone Encounter (Signed)
Spoke with Victorino Dike at [facility]. Patient scheduled for screening MMG on 11/23/19 at 4:20pm, arrive at 4pm.   Call to patient, left detailed message on mobile number, ok per dpr. Advised of appt as seen above. Provided patient with [facility] contact information and address. Contact [facility] directly if you need to make any changes to your appt. Return call to office if you have any additional questions.   Routing to provider for final review. Patient is agreeable to disposition. Will close encounter.

## 2019-11-20 NOTE — Telephone Encounter (Signed)
[facility] to schedule a screening mammogram for the patient.  Her mother had breast cancer at age 26.

## 2019-11-21 LAB — HEPATITIS C ANTIBODY: Hep C Virus Ab: <0.1 s/co ratio (ref 0.0–0.9)

## 2019-11-21 LAB — CBC
Hematocrit: 40.4 % (ref 34.0–46.6)
Hemoglobin: 13.9 g/dL (ref 11.1–15.9)
MCH: 32.7 pg (ref 26.6–33.0)
MCHC: 34.4 g/dL (ref 31.5–35.7)
MCV: 95 fL (ref 79–97)
Platelets: 331 10*3/uL (ref 150–450)
RBC: 4.25 x10E6/uL (ref 3.77–5.28)
RDW: 11.7 % (ref 11.7–15.4)
WBC: 9.4 10*3/uL (ref 3.4–10.8)

## 2019-11-21 LAB — CYTOLOGY - PAP
Comment: NEGATIVE
Comment: NEGATIVE
Diagnosis: NEGATIVE
High risk HPV: NEGATIVE
Trichomonas: NEGATIVE

## 2019-11-21 LAB — BETA HCG QUANT (REF LAB): hCG Quant: <1 m[IU]/mL

## 2019-11-21 LAB — HEP, RPR, HIV PANEL
HIV Screen 4th Generation wRfx: NONREACTIVE
Hepatitis B Surface Ag: NEGATIVE
RPR Ser Ql: NONREACTIVE

## 2019-11-21 LAB — TSH: TSH: 1.23 u[IU]/mL (ref 0.450–4.500)

## 2019-11-23 ENCOUNTER — Ambulatory Visit: Payer: Self-pay

## 2019-11-24 ENCOUNTER — Telehealth: Payer: Self-pay | Admitting: Obstetrics and Gynecology

## 2019-11-24 NOTE — Telephone Encounter (Signed)
Call placed  

## 2019-11-24 NOTE — Telephone Encounter (Signed)
Call placed to convey benefits for iud removal. 

## 2019-12-01 ENCOUNTER — Ambulatory Visit
Admission: RE | Admit: 2019-12-01 | Discharge: 2019-12-01 | Disposition: A | Discharge status: Home / Self Care | Payer: BC Managed Care – PPO | Source: Ambulatory Visit | Attending: Obstetrics and Gynecology | Admitting: Obstetrics and Gynecology

## 2019-12-01 ENCOUNTER — Other Ambulatory Visit: Payer: Self-pay

## 2019-12-01 DIAGNOSIS — Z1231 Encounter for screening mammogram for malignant neoplasm of breast: Secondary | ICD-10-CM | POA: Diagnosis not present

## 2019-12-01 DIAGNOSIS — Z803 Family history of malignant neoplasm of breast: Secondary | ICD-10-CM

## 2019-12-06 ENCOUNTER — Telehealth: Payer: Self-pay

## 2019-12-06 DIAGNOSIS — Z30433 Encounter for removal and reinsertion of intrauterine contraceptive device: Secondary | ICD-10-CM

## 2019-12-06 NOTE — Telephone Encounter (Signed)
Spoke with patient regarding benefits for scheduled IUD exchange. Patient acknowledges understanding of information presented. Encounter closed.

## 2019-12-06 NOTE — Telephone Encounter (Signed)
AEX 11/20/19  Spoke with pt. Pt states needing to have Skyla IUD removed since over due. Pt states was placed in 2015. Verified through OV notes, Skyla IUD was placed on 09/22/2013 by Dr Farrel Gobble.  Pt states would like IUD exchange, but doesn't know if wants Palau or Mirena IUD for longer term. Pt is not interested in pregnancy planning at this time.  Pt advised can make OV for IUD exchange and discuss options at time of appt. Pt agreeable and verbalized understanding.  Pt scheduled for IUD exchange on 12/26/19 at 1 pm with Dr Edward Jolly. Pt agreeable to date and time of appt. Pt advised can take Motrin 800 mg with food and water one hour before procedure. Pt agreeable.   Routing to Dr Edward Jolly for review.  Encounter closed.  Cc: Hedda Slade for precert. Orders placed.

## 2019-12-06 NOTE — Telephone Encounter (Signed)
Patient is calling for IUD removal.

## 2019-12-08 NOTE — Telephone Encounter (Signed)
Spoke with pt. Pt given update and recommendations per Dr Edward Jolly. Pt states vaginal bleeding that she had when she was here for her AEX has stopped x 1.5 weeks ago around 7/19. Pt denies any bleeding at all, spotting, clots at this time.  Pt advised since bleeding had stopped, ok to keep appt on 12/26/19 for IUD exchange. Pt also advised to call back if has any further bleeding before appt. Pt agreeable and verbalized understanding.   Routing to Dr Edward Jolly for review. Encounter closed.

## 2019-12-08 NOTE — Telephone Encounter (Signed)
Left message for pt to return call to triage RN. 

## 2019-12-08 NOTE — Telephone Encounter (Signed)
Please contact patient to see if she is still having bleeding.  When she came in for her visit, she had been bleeding for several weeks.   If she is still bleeding, I recommend a course of Provera 10 mg x 10 days.  (Do a UPT first and if negative, she can take the Provera.) If this stops the bleeding, then I can remove the expired Skyla and place the new IUD on the same day.  If this does not stop the bleeding, then I recommend removing the expired IUD and placing the new IUD on another day.

## 2019-12-21 NOTE — Progress Notes (Signed)
GYNECOLOGY  VISIT   HPI: 24 y.o.   Single  Caucasian  female   G0P0000 with Patient's last menstrual period was 10/23/2019 (exact date).   here for IUD exchange.  She has an expired Skyla IUD.  Menses prior to IUD were average, not particularly heavy or painful.  Her vaginal bleeding stopped 1 or 2 weeks after her last visit.   She took 2 Advil 8:00 am.   UPT:  Neg   GYNECOLOGIC HISTORY: Patient's last menstrual period was 10/23/2019 (exact date). Contraception: Skyla IUD 08-23-13--Expired Menopausal hormone therapy:  n/a Last mammogram: 12-01-19 3D/Neg/density C/Birads1 Last pap smear:  11-20-19 Neg:Neg HR HPv        OB History    Gravida  0   Para  0   Term  0   Preterm  0   AB  0   Living  0     SAB  0   TAB  0   Ectopic  0   Multiple  0   Live Births                 Patient Active Problem List   Diagnosis Date Noted  . IUD (intrauterine device) in place 08/23/2013  . Anxiety state, unspecified 09/20/2012    Past Medical History:  Diagnosis Date  . Anemia   . Anxiety    Tx'd at age 67 and gradually weaned off med  . Depression    tx'd at age 71 and eventually weaned off medication  . Dysmenorrhea   . HSV-1 infection     Past Surgical History:  Procedure Laterality Date  . TYMPANOSTOMY TUBE PLACEMENT     When she was a baby  . WISDOM TOOTH EXTRACTION  2011    Current Outpatient Medications  Medication Sig Dispense Refill  . EPIPEN 2-PAK 0.3 MG/0.3ML SOAJ 0.3 mg.     No current facility-administered medications for this visit.     ALLERGIES: Bee venom  Family History  Problem Relation Age of Onset  . Breast cancer Mother 60       stage 1, ductal, nonfiltrating-chemo, lumpectomy  . Infertility Maternal Aunt   . Breast cancer Maternal Aunt 44       ductal, dbl mastectomy-no further rx  . Infertility Maternal Grandmother   . Cancer Maternal Grandfather        lung-smoker    Social History   Socioeconomic History  . Marital  status: Single    Spouse name: Not on file  . Number of children: Not on file  . Years of education: Not on file  . Highest education level: Not on file  Occupational History  . Not on file  Tobacco Use  . Smoking status: Current Every Day Smoker    Packs/day: 0.25    Years: 4.00    Pack years: 1.00    Types: Cigarettes  . Smokeless tobacco: Current User  . Tobacco comment: 2 packs/week  Vaping Use  . Vaping Use: Some days  . Substances: Nicotine  Substance and Sexual Activity  . Alcohol use: Yes    Alcohol/week: 21.0 standard drinks    Types: 21 Standard drinks or equivalent per week  . Drug use: Yes    Types: Marijuana    Comment: smokes marijuana rarely  . Sexual activity: Yes    Partners: Male    Birth control/protection: I.U.D.    Comment: Mirena inserted 2015 or 2016  Other Topics Concern  . Not on file  Social  History Narrative  . Not on file   Social Determinants of Health   Financial Resource Strain:   . Difficulty of Paying Living Expenses:   Food Insecurity:   . Worried About Programme researcher, broadcasting/film/video in the Last Year:   . Barista in the Last Year:   Transportation Needs:   . Freight forwarder (Medical):   Marland Kitchen Lack of Transportation (Non-Medical):   Physical Activity:   . Days of Exercise per Week:   . Minutes of Exercise per Session:   Stress:   . Feeling of Stress :   Social Connections:   . Frequency of Communication with Friends and Family:   . Frequency of Social Gatherings with Friends and Family:   . Attends Religious Services:   . Active Member of Clubs or Organizations:   . Attends Banker Meetings:   Marland Kitchen Marital Status:   Intimate Partner Violence:   . Fear of Current or Ex-Partner:   . Emotionally Abused:   Marland Kitchen Physically Abused:   . Sexually Abused:     Review of Systems  All other systems reviewed and are negative.   PHYSICAL EXAMINATION:    BP 100/70 (Cuff Size: Large)   Pulse 76   Ht 5\' 8"  (1.727 m)   Wt  212 lb (96.2 kg)   LMP 10/23/2019 (Exact Date)   BMI 32.23 kg/m     General appearance: alert, cooperative and appears stated age   Pelvic: External genitalia:  no lesions              Urethra:  normal appearing urethra with no masses, tenderness or lesions              Bartholins and Skenes: normal                 Vagina: normal appearing vagina with normal color and discharge, no lesions              Cervix: no lesions.  IUD strings noted.                 Bimanual Exam:  Uterus:  normal size, contour, position, consistency, mobility, non-tender              Adnexa: no mass, fullness, tenderness            IUD exchange.  Removal of Skyla IUD and placement of [name] IUD.  [name] IUD - lot number [id number], expiration March 2023. Sterile prep with Hibiclens.  Paracervical block 10 cc 1% lidocaine, lot April 2023, exp May 2024. Skyla IUD removed with ring forceps, intact, shown to patient, and discarded. Uterus sounded to 6.5 cm.  [name] placed without difficulty.  Strings trimmed.  Bimanual exam - no change.  No complications.  Minimal EBL.   Consent for procedures.  Chaperone was present for exam.  ASSESSMENT  Removal of Skyla IUD.  Placement of [name] IUD.   PLAN  Back up protection for one week.  Condoms recommended.  New IUD card to patient. IUD insertion precautions given.  FU in 4 weeks.

## 2019-12-26 ENCOUNTER — Telehealth: Payer: Self-pay | Admitting: Obstetrics and Gynecology

## 2019-12-26 ENCOUNTER — Other Ambulatory Visit: Payer: Self-pay

## 2019-12-26 ENCOUNTER — Ambulatory Visit: Payer: BC Managed Care – PPO | Admitting: Obstetrics and Gynecology

## 2019-12-26 ENCOUNTER — Encounter: Payer: Self-pay | Admitting: Obstetrics and Gynecology

## 2019-12-26 VITALS — BP 100/70 | HR 76 | Ht 68.0 in | Wt 212.0 lb

## 2019-12-26 DIAGNOSIS — Z01812 Encounter for preprocedural laboratory examination: Secondary | ICD-10-CM | POA: Diagnosis not present

## 2019-12-26 DIAGNOSIS — Z30433 Encounter for removal and reinsertion of intrauterine contraceptive device: Secondary | ICD-10-CM

## 2019-12-26 LAB — POCT URINE PREGNANCY: Preg Test, Ur: NEGATIVE

## 2019-12-26 NOTE — Telephone Encounter (Signed)
Call returned to patient. Left message advising 4 wk recheck scheduled for 01/23/20 at 4:30pm with Dr. Edward Jolly, arrive at 4:15pm. Return call to office if you need to make any changes to this appt.   Encounter closed.

## 2019-12-26 NOTE — Telephone Encounter (Signed)
Patient was seen today and needs a 4 week IUD follow up with Dr.Silva. To triage to assist with scheduling.

## 2020-01-10 ENCOUNTER — Encounter: Payer: Self-pay | Admitting: Obstetrics and Gynecology

## 2020-01-23 ENCOUNTER — Encounter: Payer: Self-pay | Admitting: Obstetrics and Gynecology

## 2020-01-23 ENCOUNTER — Ambulatory Visit: Payer: BC Managed Care – PPO | Admitting: Obstetrics and Gynecology

## 2020-01-23 ENCOUNTER — Other Ambulatory Visit: Payer: Self-pay

## 2020-01-23 VITALS — BP 116/68 | HR 76 | Resp 14 | Ht 68.0 in | Wt 209.0 lb

## 2020-01-23 DIAGNOSIS — Z803 Family history of malignant neoplasm of breast: Secondary | ICD-10-CM

## 2020-01-23 DIAGNOSIS — Z30431 Encounter for routine checking of intrauterine contraceptive device: Secondary | ICD-10-CM | POA: Diagnosis not present

## 2020-01-23 NOTE — Progress Notes (Signed)
GYNECOLOGY  VISIT   HPI: 24 y.o.   Single  Caucasian  female   G0P0000 with Patient's last menstrual period was 01/02/2020.   here for IUD check     She had a period that lasted 2 weeks and lingered.  Cramping was not too bad.   Sexually active.  No pain.   Patient does share that she thinks her mother tested positive for a genetic mutation.  Her mother had premenopausal breast cancer.   GYNECOLOGIC HISTORY: Patient's last menstrual period was 01/02/2020. Contraception:  [name] IUD Menopausal hormone therapy:  n/a Last mammogram:  12/01/19 BIRADS 1 negative/density c Last pap smear:   11/20/19 Neg:Neg HR HPV        OB History    Gravida  0   Para  0   Term  0   Preterm  0   AB  0   Living  0     SAB  0   TAB  0   Ectopic  0   Multiple  0   Live Births                 Patient Active Problem List   Diagnosis Date Noted  . IUD (intrauterine device) in place 08/23/2013  . Anxiety state, unspecified 09/20/2012    Past Medical History:  Diagnosis Date  . Anemia   . Anxiety    Tx'd at age 66 and gradually weaned off med  . Depression    tx'd at age 53 and eventually weaned off medication  . Dysmenorrhea   . HSV-1 infection     Past Surgical History:  Procedure Laterality Date  . TYMPANOSTOMY TUBE PLACEMENT     When she was a baby  . WISDOM TOOTH EXTRACTION  2011    Current Outpatient Medications  Medication Sig Dispense Refill  . EPIPEN 2-PAK 0.3 MG/0.3ML SOAJ 0.3 mg.    . levonorgestrel ([name]) 19.5 MG IUD by Intrauterine route once.     No current facility-administered medications for this visit.     ALLERGIES: Bee venom  Family History  Problem Relation Age of Onset  . Breast cancer Mother 40       stage 1, ductal, nonfiltrating-chemo, lumpectomy  . Infertility Maternal Aunt   . Breast cancer Maternal Aunt 44       ductal, dbl mastectomy-no further rx  . Infertility Maternal Grandmother   . Cancer Maternal Grandfather         lung-smoker    Social History   Socioeconomic History  . Marital status: Single    Spouse name: Not on file  . Number of children: Not on file  . Years of education: Not on file  . Highest education level: Not on file  Occupational History  . Not on file  Tobacco Use  . Smoking status: Current Every Day Smoker    Packs/day: 0.25    Years: 4.00    Pack years: 1.00    Types: Cigarettes  . Smokeless tobacco: Current User  . Tobacco comment: 2 packs/week  Vaping Use  . Vaping Use: Some days  . Substances: Nicotine  Substance and Sexual Activity  . Alcohol use: Yes    Alcohol/week: 21.0 standard drinks    Types: 21 Standard drinks or equivalent per week  . Drug use: Yes    Types: Marijuana    Comment: smokes marijuana rarely  . Sexual activity: Yes    Partners: Male    Birth control/protection: I.U.D.  Comment: Mirena inserted 2015 or 2016  Other Topics Concern  . Not on file  Social History Narrative  . Not on file   Social Determinants of Health   Financial Resource Strain:   . Difficulty of Paying Living Expenses: Not on file  Food Insecurity:   . Worried About Programme researcher, broadcasting/film/video in the Last Year: Not on file  . Ran Out of Food in the Last Year: Not on file  Transportation Needs:   . Lack of Transportation (Medical): Not on file  . Lack of Transportation (Non-Medical): Not on file  Physical Activity:   . Days of Exercise per Week: Not on file  . Minutes of Exercise per Session: Not on file  Stress:   . Feeling of Stress : Not on file  Social Connections:   . Frequency of Communication with Friends and Family: Not on file  . Frequency of Social Gatherings with Friends and Family: Not on file  . Attends Religious Services: Not on file  . Active Member of Clubs or Organizations: Not on file  . Attends Banker Meetings: Not on file  . Marital Status: Not on file  Intimate Partner Violence:   . Fear of Current or Ex-Partner: Not on file  .  Emotionally Abused: Not on file  . Physically Abused: Not on file  . Sexually Abused: Not on file    Review of Systems  Constitutional: Negative.   HENT: Negative.   Eyes: Negative.   Respiratory: Negative.   Cardiovascular: Negative.   Gastrointestinal: Negative.   Endocrine: Negative.   Genitourinary: Negative.   Musculoskeletal: Negative.   Skin: Negative.   Allergic/Immunologic: Negative.   Neurological: Negative.   Hematological: Negative.   Psychiatric/Behavioral: Negative.     PHYSICAL EXAMINATION:    BP 116/68 (BP Location: Right Arm, Patient Position: Sitting, Cuff Size: Normal)   Pulse 76   Resp 14   Ht 5\' 8"  (1.727 m)   Wt 209 lb (94.8 kg)   LMP 01/02/2020   BMI 31.78 kg/m     General appearance: alert, cooperative and appears stated age   Pelvic: External genitalia:  no lesions              Urethra:  normal appearing urethra with no masses, tenderness or lesions              Bartholins and Skenes: normal                 Vagina: normal appearing vagina with normal color and discharge, no lesions              Cervix: no lesions.  IUD strings noted.                Bimanual Exam:  Uterus:  normal size, contour, position, consistency, mobility, non-tender              Adnexa: no mass, fullness, tenderness         Chaperone was present for exam.  ASSESSMENT  [name] IUD check up. FH breast cancer.  Her mother did test positive for a gene alteration per patient.   PLAN  Reassurance regarding IUD bleeding profile.  We discussed genetic counseling and testing and benefits of having this information.  She currently declines this.  I encouraged her to have discussion with her mother to understand the genetic alteration. I invited the patient to have a follow up visit her for further information following that conversation.  Annual  exam due in July.

## 2020-11-04 DIAGNOSIS — M79641 Pain in right hand: Secondary | ICD-10-CM | POA: Diagnosis not present

## 2020-11-04 DIAGNOSIS — M79642 Pain in left hand: Secondary | ICD-10-CM | POA: Diagnosis not present

## 2020-11-14 NOTE — Progress Notes (Signed)
25 y.o. G0P0000 Single Caucasian female here for annual exam.    Not really having bleeding for 2 -3 months.  Happy with Karen Goodman.   Taking glucosamine, vit D, K, and collagen.  Has 3 - 4 servings dairy per day.   Anxiety and depression are under control.  Has deleted her social media accounts.   Has a new boyfriend who is wonderful.  Working going well.   PCP:   None.  No LMP recorded. (Menstrual status: IUD).           Sexually active: Yes.    The current method of family planning is IUD.  Karen Goodman 01/02/20. Exercising: Yes.    Gym.  Smoker:  yes  Health Maintenance: Pap:  11-20-19 normal neg HPV History of abnormal Pap:  no MMG:  12-01-19 normal Colonoscopy:  N/A BMD:   N/A  Result  N/A TDaP: 8 yrs ago Gardasil:   yes HIV:NR Hep C:Neg Screening Labs:  Hb today: PCP, Urine today: PCP   reports that she has been smoking cigarettes. She has a 1.00 pack-year smoking history. She uses smokeless tobacco. She reports current alcohol use of about 21.0 standard drinks of alcohol per week. She reports previous drug use.  Past Medical History:  Diagnosis Date   Anemia    Anxiety    Tx'd at age 50 and gradually weaned off med   Depression    tx'd at age 42 and eventually weaned off medication   Dysmenorrhea    HSV-1 infection     Past Surgical History:  Procedure Laterality Date   TYMPANOSTOMY TUBE PLACEMENT     When she was a baby   WISDOM TOOTH EXTRACTION  2011    Current Outpatient Medications  Medication Sig Dispense Refill   EPIPEN 2-PAK 0.3 MG/0.3ML SOAJ 0.3 mg.     levonorgestrel ([name]) 19.5 MG IUD by Intrauterine route once.     meloxicam (MOBIC) 15 MG tablet Take 15 mg by mouth daily.     No current facility-administered medications for this visit.    Family History  Problem Relation Age of Onset   Breast cancer Mother 46       stage 1, ductal, nonfiltrating-chemo, lumpectomy   Infertility Maternal Aunt    Breast cancer Maternal Aunt 44        ductal, dbl mastectomy-no further rx   Ovarian cancer Maternal Grandmother    Infertility Maternal Grandmother    Cancer Maternal Grandfather        lung-smoker    Review of Systems  All other systems reviewed and are negative.  Exam:   BP 118/74 (BP Location: Left Arm, Patient Position: Sitting, Cuff Size: Normal)   Pulse 89   Ht 5\' 9"  (1.753 m)   Wt 207 lb (93.9 kg)   SpO2 97%   BMI 30.57 kg/m     General appearance: alert, cooperative and appears stated age Head: normocephalic, without obvious abnormality, atraumatic Neck: no adenopathy, supple, symmetrical, trachea midline and thyroid normal to inspection and palpation Lungs: clear to auscultation bilaterally Breasts: normal appearance, no masses or tenderness, No nipple retraction or dimpling, No nipple discharge or bleeding, No axillary adenopathy Heart: regular rate and rhythm Abdomen: soft, non-tender; no masses, no organomegaly Extremities: extremities normal, atraumatic, no cyanosis or edema Skin: skin color, texture, turgor normal. No rashes or lesions Lymph nodes: cervical, supraclavicular, and axillary nodes normal. Neurologic: grossly normal  Pelvic: External genitalia:  no lesions  No abnormal inguinal nodes palpated.              Urethra:  normal appearing urethra with no masses, tenderness or lesions              Bartholins and Skenes: normal                 Vagina: normal appearing vagina with normal color and discharge, no lesions              Cervix: no lesions.  IUD strings seen.               Pap taken: no. Bimanual Exam:  Uterus:  normal size, contour, position, consistency, mobility, non-tender              Adnexa: no mass, fullness, tenderness             Chaperone was present for exam.  Assessment:   Well woman visit with normal exam. [name] IUD. HSV I.   No outbreaks.  FH breast early onset breast cancer and FH ovarian cancer.   Mother had one genetic abnormality per patient.   Smoking.   Declines cessation.  Anxiety and depression.  Plan: Mammogram screening discussed.  She will do yearly and [facility] at [facility].  Contact information given. Self breast awareness reviewed. Pap 2024.  STD screening.  Declines Valtrex. Guidelines for Calcium, Vitamin D, regular exercise program including cardiovascular and weight bearing exercise. STD screening.  Referral for genetics counseling and possible testing.   She will bring her mother's information with her to the consultation.  Follow up annually and prn.    After visit summary provided.

## 2020-11-20 ENCOUNTER — Other Ambulatory Visit: Payer: Self-pay

## 2020-11-20 ENCOUNTER — Ambulatory Visit (INDEPENDENT_AMBULATORY_CARE_PROVIDER_SITE_OTHER): Payer: BC Managed Care – PPO | Admitting: Obstetrics and Gynecology

## 2020-11-20 ENCOUNTER — Other Ambulatory Visit (HOSPITAL_COMMUNITY)
Admission: RE | Admit: 2020-11-20 | Discharge: 2020-11-20 | Disposition: A | Discharge status: Home / Self Care | Payer: BC Managed Care – PPO | Source: Ambulatory Visit | Attending: Obstetrics and Gynecology | Admitting: Obstetrics and Gynecology

## 2020-11-20 ENCOUNTER — Encounter: Payer: Self-pay | Admitting: Obstetrics and Gynecology

## 2020-11-20 VITALS — BP 118/74 | HR 89 | Ht 69.0 in | Wt 207.0 lb

## 2020-11-20 DIAGNOSIS — Z803 Family history of malignant neoplasm of breast: Secondary | ICD-10-CM

## 2020-11-20 DIAGNOSIS — Z113 Encounter for screening for infections with a predominantly sexual mode of transmission: Secondary | ICD-10-CM | POA: Insufficient documentation

## 2020-11-20 DIAGNOSIS — Z01419 Encounter for gynecological examination (general) (routine) without abnormal findings: Secondary | ICD-10-CM

## 2020-11-20 DIAGNOSIS — Z8041 Family history of malignant neoplasm of ovary: Secondary | ICD-10-CM | POA: Diagnosis not present

## 2020-11-20 NOTE — Patient Instructions (Signed)

## 2020-11-21 LAB — CBC
HCT: 42.2 % (ref 35.0–45.0)
Hemoglobin: 14.1 g/dL (ref 11.7–15.5)
MCH: 30.9 pg (ref 27.0–33.0)
MCHC: 33.4 g/dL (ref 32.0–36.0)
MCV: 92.3 fL (ref 80.0–100.0)
MPV: 8.5 fL (ref 7.5–12.5)
Platelets: 350 10*3/uL (ref 140–400)
RBC: 4.57 10*6/uL (ref 3.80–5.10)
RDW: 11.6 % (ref 11.0–15.0)
WBC: 5.9 10*3/uL (ref 3.8–10.8)

## 2020-11-21 LAB — HEPATITIS C ANTIBODY
Hepatitis C Ab: NONREACTIVE
SIGNAL TO CUT-OFF: 0.01 (ref <1.00)

## 2020-11-21 LAB — COMPREHENSIVE METABOLIC PANEL
AG Ratio: 1.9 (calc) (ref 1.0–2.5)
ALT: 17 U/L (ref 6–29)
AST: 17 U/L (ref 10–30)
Albumin: 4.8 g/dL (ref 3.6–5.1)
Alkaline phosphatase (APISO): 49 U/L (ref 31–125)
BUN: 16 mg/dL (ref 7–25)
CO2: 23 mmol/L (ref 20–32)
Calcium: 9.5 mg/dL (ref 8.6–10.2)
Chloride: 109 mmol/L (ref 98–110)
Creat: 0.68 mg/dL (ref 0.50–0.96)
Globulin: 2.5 g/dL (calc) (ref 1.9–3.7)
Glucose, Bld: 90 mg/dL (ref 65–99)
Potassium: 4.4 mmol/L (ref 3.5–5.3)
Sodium: 141 mmol/L (ref 135–146)
Total Bilirubin: 0.3 mg/dL (ref 0.2–1.2)
Total Protein: 7.3 g/dL (ref 6.1–8.1)

## 2020-11-21 LAB — CERVICOVAGINAL ANCILLARY ONLY
Chlamydia: NEGATIVE
Comment: NEGATIVE
Comment: NEGATIVE
Comment: NORMAL
Neisseria Gonorrhea: NEGATIVE
Trichomonas: NEGATIVE

## 2020-11-21 LAB — LIPID PANEL
Cholesterol: 235 mg/dL — ABNORMAL HIGH (ref <200)
HDL: 63 mg/dL (ref >=50)
LDL Cholesterol (Calc): 120 mg/dL (calc) — ABNORMAL HIGH
Non-HDL Cholesterol (Calc): 172 mg/dL (calc) — ABNORMAL HIGH (ref <130)
Total CHOL/HDL Ratio: 3.7 (calc) (ref <5.0)
Triglycerides: 367 mg/dL — ABNORMAL HIGH (ref <150)

## 2020-11-21 LAB — HIV ANTIBODY (ROUTINE TESTING W REFLEX): HIV 1&2 Ab, 4th Generation: NONREACTIVE

## 2020-11-21 LAB — HEPATITIS B SURFACE ANTIGEN: Hepatitis B Surface Ag: NONREACTIVE

## 2020-11-21 LAB — RPR: RPR Ser Ql: NONREACTIVE

## 2020-11-24 ENCOUNTER — Encounter: Payer: Self-pay | Admitting: Obstetrics and Gynecology

## 2020-12-13 DIAGNOSIS — M79641 Pain in right hand: Secondary | ICD-10-CM | POA: Diagnosis not present

## 2020-12-13 DIAGNOSIS — M79642 Pain in left hand: Secondary | ICD-10-CM | POA: Diagnosis not present

## 2020-12-23 DIAGNOSIS — M25532 Pain in left wrist: Secondary | ICD-10-CM | POA: Diagnosis not present

## 2020-12-23 DIAGNOSIS — M25531 Pain in right wrist: Secondary | ICD-10-CM | POA: Diagnosis not present

## 2021-01-09 HISTORY — PX: CARPAL TUNNEL RELEASE: SHX101

## 2021-01-21 DIAGNOSIS — G5601 Carpal tunnel syndrome, right upper limb: Secondary | ICD-10-CM | POA: Diagnosis not present

## 2021-02-20 DIAGNOSIS — G5602 Carpal tunnel syndrome, left upper limb: Secondary | ICD-10-CM | POA: Diagnosis not present

## 2022-01-27 IMAGING — MG DIGITAL SCREENING BILAT W/ TOMO W/ CAD
6 of 12 series · 6 of 36 positions shown · non-contrast
Comparison: None.

CLINICAL DATA: Screening.

EXAM:
DIGITAL SCREENING BILATERAL MAMMOGRAM WITH TOMO AND CAD

[R MLO synth-2D]
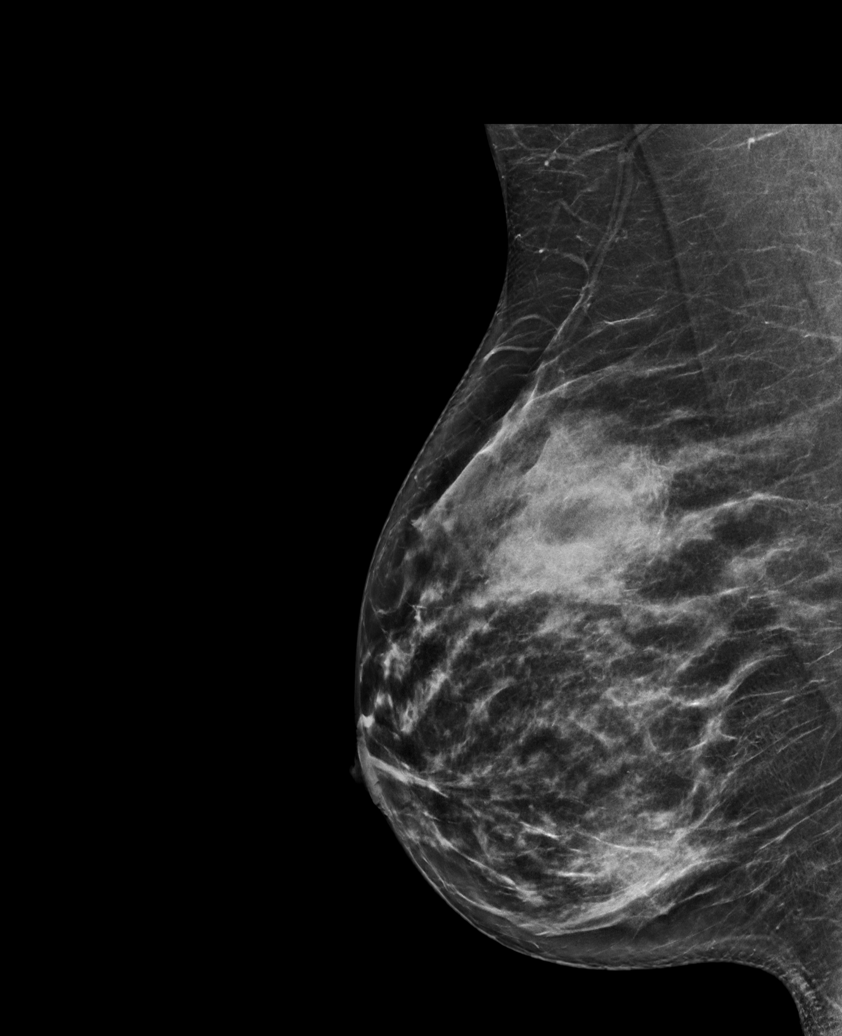

[R CC synth-2D (1 of 2)]
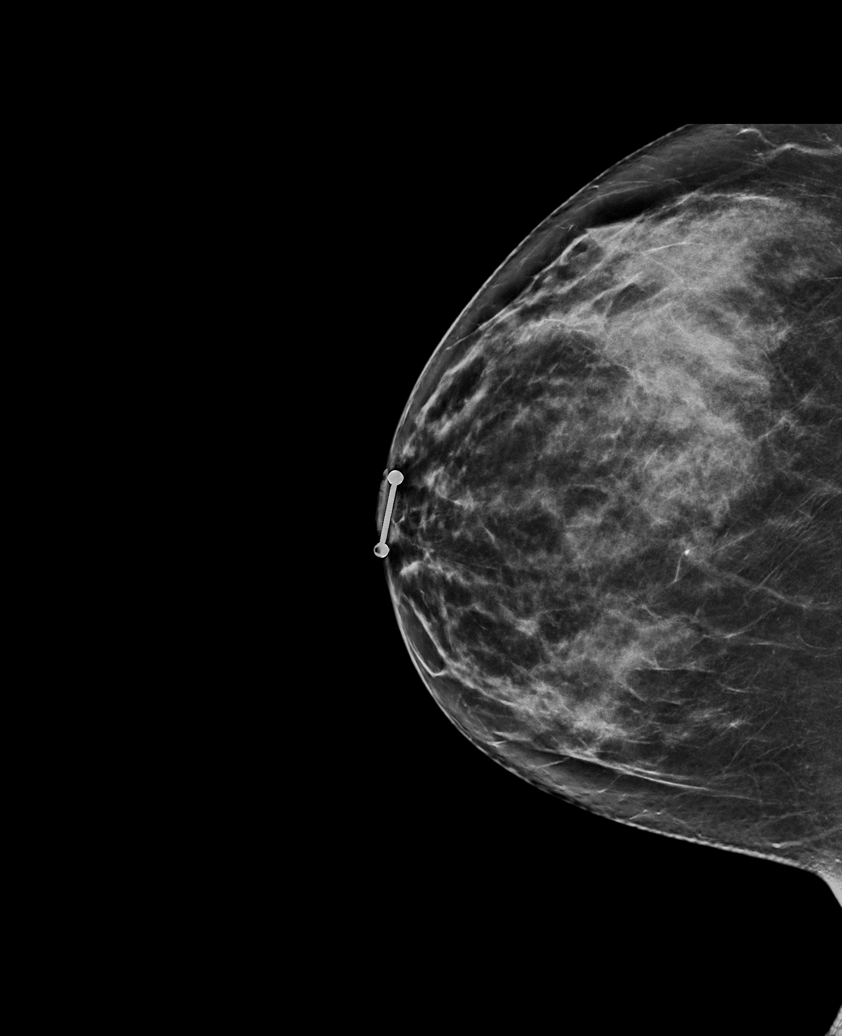

[L CC synth-2D (1 of 2)]
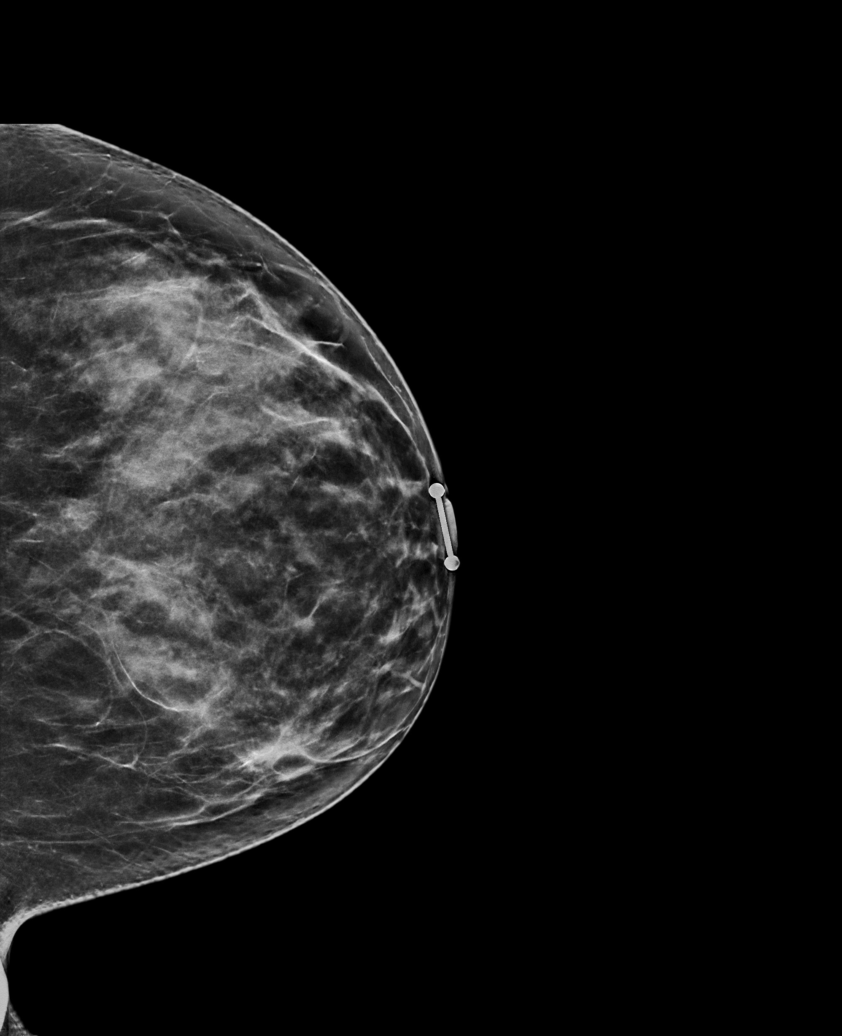

[L MLO synth-2D]
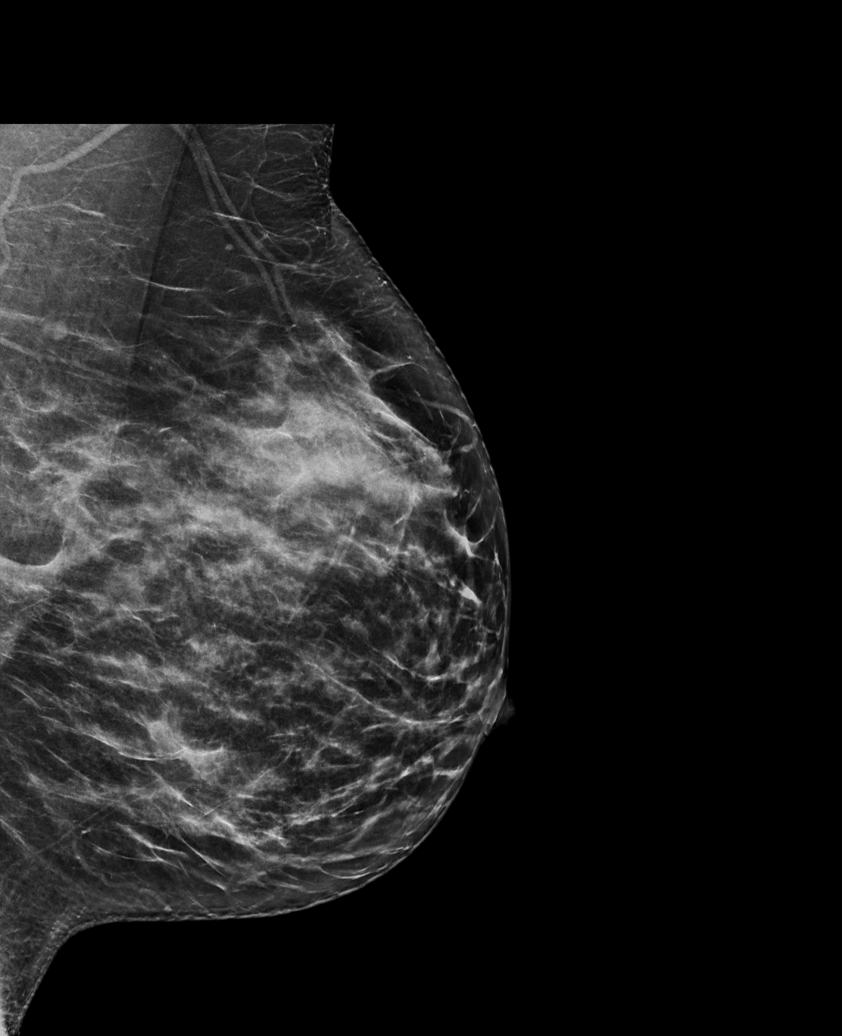

[L CC synth-2D (2 of 2)]
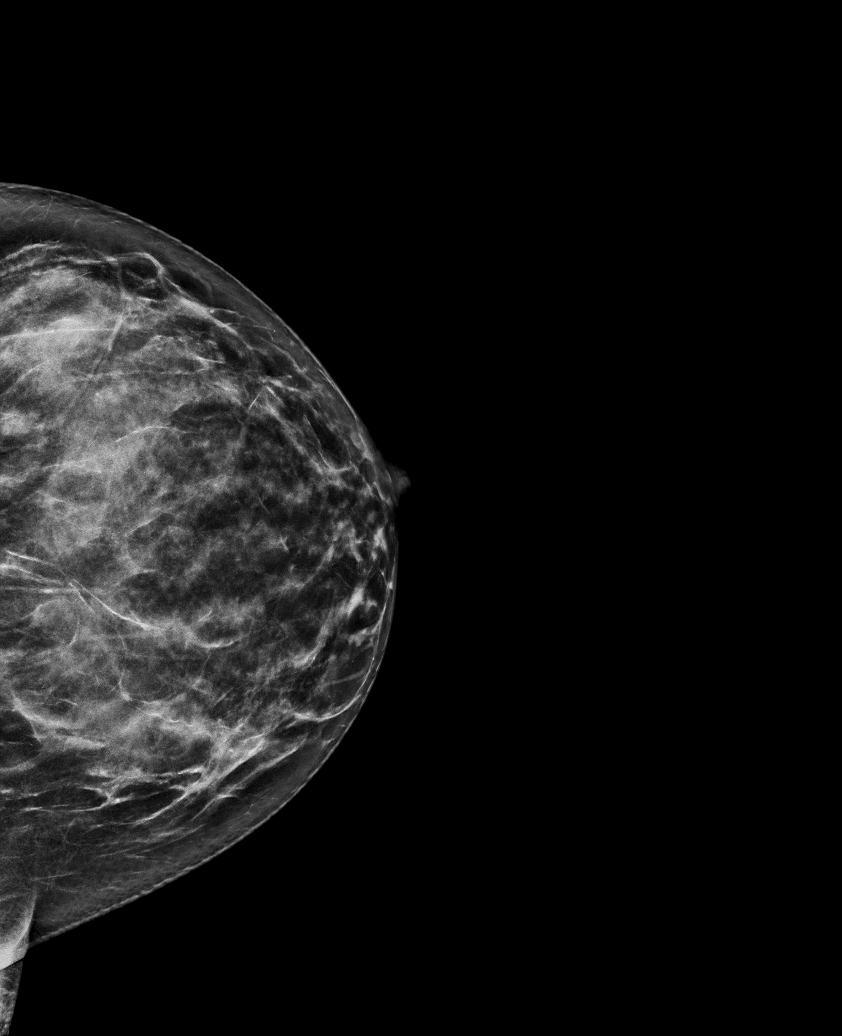

[R CC synth-2D (2 of 2)]
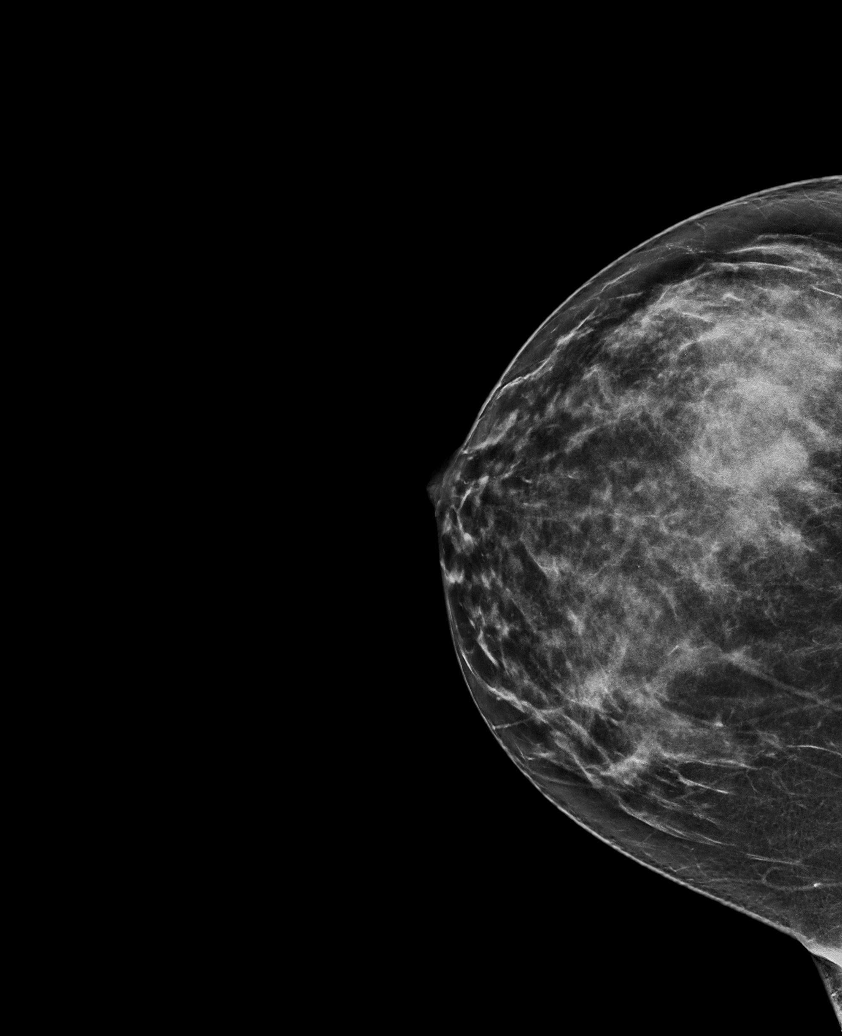

[6 of 36 positions shown; findings below may reference images not displayed]

ACR Breast Density Category c: The breast tissue is heterogeneously
dense, which may obscure small masses
FINDINGS: There are no findings suspicious for malignancy. Images were
processed with CAD.
IMPRESSION: No mammographic evidence of malignancy. A result letter of this
screening mammogram will be mailed directly to the patient.

RECOMMENDATION:
Annual screening mammography.  (Code:HP-D-35G)

Patient has an elevated risk for developing breast carcinoma based
on family history, mother diagnosed at age 36. Based on this
history, recommend beginning annual screening mammography. Consider
annual breast MRI as supplemental screening given the patient's
heterogeneously dense breast architecture on mammography.

BI-RADS CATEGORY  1: Negative.

## 2022-08-24 NOTE — Progress Notes (Signed)
27 y.o. G0P0000 [name] here for annual exam.    Spotting every other month.   She declined to do genetic counseling and testing in the past.  She now accepts this.   States her mental health is good.   Living with her boyfriend for one year.   She declines STD screening.   PCP:   None  No LMP recorded. (Menstrual status: IUD).    Has spotting every couple of months       Sexually active: Yes.    The current method of family planning is IUD--Karen Goodman 01/02/20.    Exercising: Yes.     Weight lifting 3-4 times a week Smoker:  vaping.  Health Maintenance: Pap:  11/20/19 neg: HR HPV neg History of abnormal Pap:  no MMG:  12/01/19 Breast Density Cat C, BI-RADS CAT  1 neg Colonoscopy:  n/a BMD:   n/a  Result  n/a TDaP:  9 years ago Gardasil:   yes HIV: 11/20/20 NR Hep C: 11/20/20 NR Screening Labs:  Will establish care with PCP.    reports that she has quit smoking. Her smoking use included cigarettes. She has a 1.00 pack-year smoking history. She uses smokeless tobacco. She reports current alcohol use of about 21.0 standard drinks of alcohol per week. She reports that she does not currently use drugs.  Past Medical History:  Diagnosis Date   Anemia    Anxiety    Tx'd at age 69 and gradually weaned off med   Depression    tx'd at age 39 and eventually weaned off medication   Dysmenorrhea    HSV-1 infection    Hyperlipidemia     Past Surgical History:  Procedure Laterality Date   CARPAL TUNNEL RELEASE Bilateral 01/2021   10./2022   TYMPANOSTOMY TUBE PLACEMENT     When she was a baby   WISDOM TOOTH EXTRACTION  05/11/2009    Current Outpatient Medications  Medication Sig Dispense Refill   EPIPEN 2-PAK 0.3 MG/0.3ML SOAJ 0.3 mg.     levonorgestrel (Karen Goodman) 19.5 MG IUD by Intrauterine route once.     No current facility-administered medications for this visit.    Family History  Problem Relation Age of Onset   Breast cancer Mother 62        stage 1, ductal, nonfiltrating-chemo, lumpectomy   Infertility Maternal Aunt    Breast cancer Maternal Aunt 44       ductal, dbl mastectomy-no further rx   Ovarian cancer Maternal Grandmother    Infertility Maternal Grandmother    Cancer Maternal Grandfather        lung-smoker    Review of Systems  All other systems reviewed and are negative.   Exam:   BP 124/80 (BP Location: Left Arm, Patient Position: Sitting, Cuff Size: Large)   Pulse 92   Ht 5' 8.5" (1.74 m)   Wt 225 lb (102.1 kg)   SpO2 97%   BMI 33.71 kg/m     General appearance: alert, cooperative and appears stated age Head: normocephalic, without obvious abnormality, atraumatic Neck: no adenopathy, supple, symmetrical, trachea midline and thyroid normal to inspection and palpation Lungs: clear to auscultation bilaterally Breasts: normal appearance, no masses or tenderness, No nipple retraction or dimpling, No nipple discharge or bleeding, No axillary adenopathy Heart: regular rate and rhythm Abdomen: soft, non-tender; no masses, no organomegaly Extremities: extremities normal, atraumatic, no cyanosis or edema Skin: skin color, texture, turgor normal. No rashes or lesions Lymph nodes: cervical, supraclavicular, and axillary  nodes normal. Neurologic: grossly normal  Pelvic: External genitalia:  no lesions              No abnormal inguinal nodes palpated.              Urethra:  normal appearing urethra with no masses, tenderness or lesions              Bartholins and Skenes: normal                 Vagina: normal appearing vagina with normal color and discharge, no lesions              Cervix: no lesions.  IUD strings noted.              Pap taken: no Bimanual Exam:  Uterus:  normal size, contour, position, consistency, mobility, non-tender              Adnexa: no mass, fullness, tenderness                Chaperone was present for exam:  Warren Lacy, CMA  Assessment:   Well woman visit with gynecologic exam. Karen Goodman  IUD. Hx HSV I.     FH breast early onset breast cancer and FH ovarian cancer.   Mother had one genetic abnormality per patient.  Hx anxiety and depression. Vaping.  Hyperlipidemia.   Plan: Mammogram screening discussed.  Our office will assist in scheduling this.  Self breast awareness reviewed. Referral for genetics counseling and possible testing.  We discussed the benefits of this today.  Pap and HR HPV in 2026. IUD to be removed in 2026.  I discussed an IUD exchange if she desires to do that in the future.  Guidelines for Calcium, Vitamin D, regular exercise program including cardiovascular and weight bearing exercise. She will establish care with a PCP for her routine labs and a recheck on her lipids.   Follow up annually and prn.   After visit summary provided.

## 2022-09-07 ENCOUNTER — Encounter: Payer: Self-pay | Admitting: Obstetrics and Gynecology

## 2022-09-07 ENCOUNTER — Ambulatory Visit (INDEPENDENT_AMBULATORY_CARE_PROVIDER_SITE_OTHER): Payer: BC Managed Care – PPO | Admitting: Obstetrics and Gynecology

## 2022-09-07 ENCOUNTER — Telehealth: Payer: Self-pay | Admitting: Obstetrics and Gynecology

## 2022-09-07 VITALS — BP 124/80 | HR 92 | Ht 68.5 in | Wt 225.0 lb

## 2022-09-07 DIAGNOSIS — Z803 Family history of malignant neoplasm of breast: Secondary | ICD-10-CM

## 2022-09-07 DIAGNOSIS — Z01419 Encounter for gynecological examination (general) (routine) without abnormal findings: Secondary | ICD-10-CM

## 2022-09-07 DIAGNOSIS — Z8041 Family history of malignant neoplasm of ovary: Secondary | ICD-10-CM

## 2022-09-07 NOTE — Telephone Encounter (Signed)
Please assist in scheduling a [facility]   She has a family history of breast cancer in her mother at age 29 yo.

## 2022-09-07 NOTE — Patient Instructions (Signed)

## 2022-09-07 NOTE — Telephone Encounter (Signed)
Order placed. TBC to reach out and schedule.

## 2022-09-14 NOTE — Telephone Encounter (Signed)
Per mammo appt notes: "09/07/22-1st attempt/ lvm/ kw"

## 2022-09-16 NOTE — Telephone Encounter (Signed)
Per mammo appt notes: "09-15-22--2nd attempt,lm-ab"

## 2022-09-24 NOTE — Telephone Encounter (Signed)
Mychart msg returned unread.

## 2022-10-19 NOTE — Telephone Encounter (Signed)
Patient has been informed.  Message read by patient 09/29/22.  Encounter reviewed and closed.

## 2022-10-27 ENCOUNTER — Encounter: Payer: Self-pay | Admitting: Genetic Counselor

## 2022-10-27 ENCOUNTER — Inpatient Hospital Stay: Payer: BC Managed Care – PPO

## 2022-10-27 ENCOUNTER — Other Ambulatory Visit: Payer: Self-pay

## 2022-10-27 ENCOUNTER — Other Ambulatory Visit: Payer: Self-pay | Admitting: Genetic Counselor

## 2022-10-27 ENCOUNTER — Inpatient Hospital Stay: Payer: BC Managed Care – PPO | Attending: Genetic Counselor | Admitting: Genetic Counselor

## 2022-10-27 DIAGNOSIS — Z803 Family history of malignant neoplasm of breast: Secondary | ICD-10-CM

## 2022-10-27 DIAGNOSIS — Z8049 Family history of malignant neoplasm of other genital organs: Secondary | ICD-10-CM | POA: Diagnosis not present

## 2022-10-27 DIAGNOSIS — R519 Headache, unspecified: Secondary | ICD-10-CM | POA: Diagnosis not present

## 2022-10-27 LAB — GENETIC SCREENING ORDER

## 2022-10-27 NOTE — Progress Notes (Addendum)
REFERRING PROVIDER: Patton Salles, MD [address] [place],  [place] [address]  PRIMARY PROVIDER:  Pcp, No  PRIMARY REASON FOR VISIT:  1. Family history of uterine cancer   2. FH: breast cancer      HISTORY OF PRESENT ILLNESS:   Karen Goodman, a 27 y.o. female, was seen for a [facility] cancer genetics consultation at the request of Dr. Ardell Isaacs due to a family history of breast cancer and a known ATM pathogenic variant.  Karen Goodman presents to clinic today to discuss the possibility of a hereditary predisposition to cancer, genetic testing, and to further clarify her future cancer risks, as well as potential cancer risks for family members.   Karen Goodman is a 27 y.o. female with no personal history of cancer.    CANCER HISTORY:  Oncology History   No history exists.     RISK FACTORS:  Menarche was at age 61.  First live birth at age N/A.  OCP use for approximately 10 years.  Ovaries intact: yes.  Hysterectomy: no.  Menopausal status: premenopausal.  HRT use: 0 years. Colonoscopy: no; not examined. Mammogram within the last year: yes. Number of breast biopsies: 0. Up to date with pelvic exams: yes. Any excessive radiation exposure in the past: no  Past Medical History:  Diagnosis Date   Anemia    Anxiety    Tx'd at age 69 and gradually weaned off med   Depression    tx'd at age 11 and eventually weaned off medication   Dysmenorrhea    Family history of breast cancer    Family history of uterine cancer    HSV-1 infection    Hyperlipidemia     Past Surgical History:  Procedure Laterality Date   CARPAL TUNNEL RELEASE Bilateral 01/2021   10./2022   TYMPANOSTOMY TUBE PLACEMENT     When she was a baby   WISDOM TOOTH EXTRACTION  05/11/2009    Social History   Socioeconomic History   Marital status: Significant Other    Spouse name: Not on file   Number of children: Not on file   Years of education: Not on file   Highest  education level: Not on file  Occupational History   Not on file  Tobacco Use   Smoking status: Former    Packs/day: 0.25    Years: 4.00    Additional pack years: 0.00    Total pack years: 1.00    Types: Cigarettes   Smokeless tobacco: Current   Tobacco comments:    2 packs/week  Vaping Use   Vaping Use: Some days   Substances: Nicotine  Substance and Sexual Activity   Alcohol use: Yes    Alcohol/week: 21.0 standard drinks of alcohol    Types: 21 Standard drinks or equivalent per week   Drug use: Not Currently    Comment: smokes marijuana rarely   Sexual activity: Yes    Partners: Male    Birth control/protection: I.U.D.    Comment: [name]  Other Topics Concern   Not on file  Social History Narrative   Not on file   Social Determinants of Health   Financial Resource Strain: Not on file  Food Insecurity: Not on file  Transportation Needs: Not on file  Physical Activity: Not on file  Stress: Not on file  Social Connections: Not on file     FAMILY HISTORY:  We obtained a detailed, 4-generation family history.  Significant diagnoses are  listed below: Family History  Problem Relation Age of Onset   Breast cancer Mother 78       ATM + stage 1, ductal, nonfiltrating-chemo, lumpectomy; second breast cancer at 11   Infertility Maternal Aunt    Breast cancer Maternal Aunt 67       ATM + ductal, dbl mastectomy-no further rx   Endometrial cancer Maternal Grandmother 72   Infertility Maternal Grandmother    Esophageal cancer Maternal Grandfather 37     The patient does not have children.  She has a brother who is cancer free.  Both parents are living  The patient's mother was diagnosed with breast cancer at 26 and 80 and was found to have an ATM mutation.  She has one sister who had breast cancer and 'blood' cancer and also has an ATM mutation.  The maternal grandmother is living and has a diagnosis of uterine cancer and the grandfather had esophageal cancer.  The  patient's father is healthy. He has one brother who does not have cancer.  There is no other reported family history of cancer.  Karen Goodman is aware of previous family history of genetic testing for hereditary cancer risks. Patient's maternal ancestors are of [place] descent, and paternal ancestors are of [place] descent. There is no reported Ashkenazi Jewish ancestry. There is no known consanguinity.  GENETIC COUNSELING ASSESSMENT: Karen Goodman is a 27 y.o. female with a family history of breast cancer and a known ATM pathogenetic mutation which is somewhat suggestive of a hereditary cancer syndrome and predisposition to cancer. We, therefore, discussed and recommended the following at today's visit.   DISCUSSION: We discussed that, in general, most cancer is not inherited in families, but instead is sporadic or familial. Sporadic cancers occur by chance and typically happen at older ages (>50 years) as this type of cancer is caused by genetic changes acquired during an individual's lifetime. Some families have more cancers than would be expected by chance; however, the ages or types of cancer are not consistent with a known genetic mutation or known genetic mutations have been ruled out. This type of familial cancer is thought to be due to a combination of multiple genetic, environmental, hormonal, and lifestyle factors. While this combination of factors likely increases the risk of cancer, the exact source of this risk is not currently identifiable or testable.  We discussed that 5 - 10% of breast cancer is hereditary, with most cases associated with BRCA mutations.  There are other genes that can be associated with hereditary breast cancer syndromes, one of which is ATM. We discussed that ATM mutations increase the risk for breast and pancreatic cancer, have a slight increased risk for ovarian cancer, and evidence suggests an increased risk for prostate cancer.  Based on the patient's mother having  a known  ATM pathogenic variant, the patient and her brother each have a 50% chance of also having this mutation.  We discussed that testing is beneficial for several reasons including knowing how to follow individuals and understanding if other family members could be at risk for cancer and allow them to undergo genetic testing.   We reviewed the characteristics, features and inheritance patterns of hereditary cancer syndromes. We also discussed genetic testing, including the appropriate family members to test, the process of testing, insurance coverage and turn-around-time for results. We discussed the implications of a negative, positive, carrier and/or variant of uncertain significant result. Karen Goodman  was offered a common hereditary cancer panel (47 genes) and  an expanded pan-cancer panel (77 genes). Karen Goodman was informed of the benefits and limitations of each panel, including that expanded pan-cancer panels contain genes that do not have clear management guidelines at this point in time.  We also discussed that as the number of genes included on a panel increases, the chances of variants of uncertain significance increases. Karen Goodman decided to pursue genetic testing for the CancerNext-Expanded+RNAinsight gene panel.   The CancerNext-Expanded gene panel offered by [employer] and includes sequencing and rearrangement analysis for the following 71 genes: AIP, ALK, APC, ATM, BAP1, BARD1, BMPR1A, BRCA1, BRCA2, BRIP1, CDC73, CDH1, CDK4, CDKN1B, CDKN2A, CHEK2, DICER1, FH, FLCN, KIF1B, LZTR1, MAX, MEN1, MET, MLH1, MSH2, MSH6, MUTYH, NF1, NF2, NTHL1, PALB2, PHOX2B, PMS2, POT1, PRKAR1A, PTCH1, PTEN, RAD51C, RAD51D, RB1, RET, SDHA, SDHAF2, SDHB, SDHC, SDHD, SMAD4, SMARCA4, SMARCB1, SMARCE1, STK11, SUFU, TMEM127, TP53, TSC1, TSC2 and VHL (sequencing and deletion/duplication); AXIN2, CTNNA1, EGFR, EGLN1, HOXB13, KIT, MITF, MSH3, PDGFRA, POLD1 and POLE (sequencing only); EPCAM and GREM1 (deletion/duplication only).  RNA data is routinely analyzed for use in variant interpretation for all genes.   Based on Karen Goodman's family history of cancer and the known ATM mutation, she meets medical criteria for genetic testing. Despite that she meets criteria, she may still have an out of pocket cost. We discussed that if her out of pocket cost for testing is over $100, the laboratory will call and confirm whether she wants to proceed with testing.  If the out of pocket cost of testing is less than $100 she will be billed by the genetic testing laboratory.   We discussed that some people do not want to undergo genetic testing due to fear of genetic discrimination.  The Genetic Information Nondiscrimination Act (GINA) was signed into federal law in 2008. GINA prohibits health insurers and most employers from discriminating against individuals based on genetic information (including the results of genetic tests and family history information). According to GINA, health insurance companies cannot consider genetic information to be a preexisting condition, nor can they use it to make decisions regarding coverage or rates. GINA also makes it illegal for most employers to use genetic information in making decisions about hiring, firing, promotion, or terms of employment. It is important to note that GINA does not offer protections for life insurance, disability insurance, or long-term care insurance. GINA does not apply to those in the [employer], those who work for companies with less than 15 employees, and new life insurance or long-term disability insurance policies.  Health status due to a cancer diagnosis is not protected under GINA. More information about GINA can be found by visiting [email or web address].   PLAN: After considering the risks, benefits, and limitations, Karen Goodman provided informed consent to pursue genetic testing and the blood sample was sent to [facility] for analysis of the  CancerNext-Expanded+RNAinsight. Results should be available within approximately 2-3 weeks' time, at which point they will be disclosed by telephone to Karen Goodman, as will any additional recommendations warranted by these results. Karen Goodman will receive a summary of her genetic counseling visit and a copy of her results once available. This information will also be available in Epic.   Lastly, we encouraged Karen Goodman to remain in contact with cancer genetics annually so that we can continuously update the family history and inform her of any changes in cancer genetics and testing that may be of benefit for this family.   Karen Goodman questions were answered to her satisfaction today. Our  contact information was provided should additional questions or concerns arise. Thank you for the referral and allowing [place] to share in the care of your patient.   Karlye Ihrig P. Lowell Guitar, MS, [facility] Licensed, Patent attorney [email or web address] phone: [phone]  The patient was seen for a total of 25 minutes in face-to-face genetic counseling.  The patient was seen alone.  Drs. Meliton Rattan, and/or [place] were available for questions, if needed..    _______________________________________________________________________ For Office Staff:  Number of people involved in session: 1 Was an Intern/ student involved with case: no

## 2022-11-19 ENCOUNTER — Encounter: Payer: Self-pay | Admitting: Genetic Counselor

## 2022-11-19 ENCOUNTER — Telehealth: Payer: Self-pay | Admitting: Genetic Counselor

## 2022-11-19 ENCOUNTER — Ambulatory Visit: Payer: Self-pay | Admitting: Genetic Counselor

## 2022-11-19 DIAGNOSIS — Z1501 Genetic susceptibility to malignant neoplasm of breast: Secondary | ICD-10-CM

## 2022-11-19 DIAGNOSIS — Z1379 Encounter for other screening for genetic and chromosomal anomalies: Secondary | ICD-10-CM

## 2022-11-19 HISTORY — DX: Genetic susceptibility to malignant neoplasm of breast: Z15.01

## 2022-11-19 HISTORY — DX: Encounter for other screening for genetic and chromosomal anomalies: Z13.79

## 2022-11-19 NOTE — Telephone Encounter (Signed)
Revealed that ATM variant was identified  This will increase her risk for breast, ovarian and pancreatic cancer.  Recommend high risk breast screening with MRI starting at 30-35.  Patient's mother was diagnosed with breast cancer at 26.  Also recommend that patient's brother undergo genetic testing.  We will refer to the high risk breast clinic.

## 2022-11-19 NOTE — Progress Notes (Addendum)
GENETIC TEST RESULTS   Patient Name: Karen Goodman Patient Age: 27 y.o. Encounter Date: 11/19/2022  Referring Provider: Janean Sark, MD    Ms. Hemming was seen in the Cancer Genetics clinic on October 27, 2022 due to a family history of cancer and concern regarding a hereditary predisposition to cancer in the family due to a known ATM pathogenic variant. Please refer to the prior Genetics clinic note for more information regarding Ms. Deats's medical and family histories and our assessment at the time.   FAMILY HISTORY:  We obtained a detailed, 4-generation family history.  Significant diagnoses are listed below: Family History  Problem Relation Age of Onset   Breast cancer Mother 52       ATM + stage 1, ductal, nonfiltrating-chemo, lumpectomy; second breast cancer at 81   Infertility Maternal Aunt    Breast cancer Maternal Aunt 26       ATM + ductal, dbl mastectomy-no further rx   Endometrial cancer Maternal Grandmother 72   Infertility Maternal Grandmother    Esophageal cancer Maternal Grandfather 22       The patient does not have children.  She has a brother who is cancer free.  Both parents are living   The patient's mother was diagnosed with breast cancer at 48 and 41 and was found to have an ATM mutation.  She has one sister who had breast cancer and 'blood' cancer and also has an ATM mutation.  The maternal grandmother is living and has a diagnosis of uterine cancer and the grandfather had esophageal cancer.   The patient's father is healthy. He has one brother who does not have cancer.  There is no other reported family history of cancer.   Ms. Brixey is aware of previous family history of genetic testing for hereditary cancer risks. Patient's maternal ancestors are of [place] descent, and paternal ancestors are of [place] descent. There is no reported Ashkenazi Jewish ancestry. There is no known consanguinity.  GENETIC TESTING:  At the time of Ms. Kessler  visit, we recommended she pursue genetic testing of the CancerNext-Expanded+RNA panel test. 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 There were no deleterious mutations in AIP, ALK, APC, BAP1, BARD1, BMPR1A, BRCA1, BRCA2, BRIP1, CDC73, CDH1, CDK4, CDKN1B, CDKN2A, CHEK2, DICER1, FH, FLCN, KIF1B, LZTR1, MAX, MEN1, MET, MLH1, MSH2, MSH6, MUTYH, NF1, NF2, NTHL1, PALB2, PHOX2B, PMS2, POT1, PRKAR1A, PTCH1, PTEN, RAD51C, RAD51D, RB1, RET, SDHA, SDHAF2, SDHB, SDHC, SDHD, SMAD4, SMARCA4, SMARCB1, SMARCE1, STK11, SUFU, TMEM127, TP53, TSC1, TSC2 and VHL (sequencing and deletion/duplication); AXIN2, CTNNA1, EGFR, EGLN1, HOXB13, KIT, MITF, MSH3, PDGFRA, POLD1 and POLE (sequencing only); EPCAM and GREM1 (deletion/duplication only). RNA data is routinely analyzed for use in variant interpretation for all genes.     DISCUSSION: Individuals with a ATM mutation are at a greater risk for having children with Ataxia-telangiectasia (A-T).  AT is characterized by progressive cerebellar degeneration (ataxia), dilated blood vessels in the eyes and skin (telangiectasia), immunodeficiency, chromosomal instability, increased sensitivity to ionizing radiation and a predisposition to lymphoma and leukemia.  Therefore, individuals of childbearing age who have a known ATM mutation may want to consider having their spouse tested to determine their risk for having a child with A-T.  Studies of these families demonstrated increased incident of breast cancer in the mothers (who are heterozygous carriers) of the affected children, thus prompting further evaluation  of the relationship between breast cancer and ATM.  Women who are heterozygous ATM carriers have an increase breast cancer risk.  They have 5-fold increased risk of breast cancer <50 years, and 2-3 fold increased risk for  breast cancer overall.  In families with familial breast cancer that were negative for BRCA1 or BRCA2 genes, approximately 2.7% of women were found to have one ATM mutation.  In families with both breast cancer and leukemia, 6.7% of women were found to have one ATM mutation.  There has been some evidence that radiation treatment may increase the risk for breast cancer in the contralateral breast. Despite this risk, we do not recommend declining radiation treatment for her breast cancer if it is recommended, as the risk for having a recurrence of breast cancer based on not going through radiation may be greater than her risk for getting breast cancer again from the radiation.   Management for individuals with ATM mutations can be found in the NCCN guidelines (v.2.2024).  These guidelines recommend the following:  Breast Cancer    Absolute risk: 20-40% Screening: Annual mammogram with consideration of tomosynthesis at age 71 and consider breast MRI with contrast starting at age 33 - 44 years. Risk Reducing Mastectomy: Evidence insufficient, consider based on family history  Ovarian Cancer  Absolute risk: 2-3% Evidence is insufficient for risk-reducing salpingo-oophorectomy (RRSO).  This risk should be managed by family history.  Pancreatic Cancer   Absolute risk: ~5-10% Screen starting at age 32 for individuals with a family history of pancreatic cancer.  Prostate Cancer Emerging evidence for association with increased risk.  No recommendations for screening at this time.  FAMILY MEMBERS: Hereditary predisposition to cancer due to pathogenic variants in the ATM gene has autosomal dominant inheritance. This means that an individual with a pathogenic variant has a 50% chance of passing the condition on to their offspring. Once a pathogenic mutation is detected in an individual, it is possible to identify at-risk relatives who can pursue testing for this specific familial variant. Many cases are  inherited from a parent, but some cases may occur spontaneously (i.e., an individual with a pathogenic variant who has parents who do not have it).  Individuals with a single pathogenic ATM variant are also carriers of autosomal recessive Ataxia Telangiectasia. Ataxia Telangiectasia is characterized by childhood onset of progressive neurologic manifestations, immunodeficiency, pulmonary disease,  and increased risk for cancer. For there to be a risk of Ataxia Telangiectasiain offspring, both the patient and their partner would each have to carry a pathogenic variant in ATM; in this case, the risk to have an affected child is 25%.It is important that all of Ms. Rosenman relatives (both men and women) know of the presence of this gene mutation. Site-specific genetic testing can sort out who in the family is at risk and who is not. It is important that all of Ms. Ricciuti relatives (both men and women) know of the presence of this gene mutation. Site-specific genetic testing can sort out who in the family is at risk and who is not.   Ms. Bartholomew brother have a 50% chance to have inherited this mutation. We recommend they have genetic testing for this same mutation, as identifying the presence of this mutation would allow them to also take advantage of risk-reducing measures.   SUPPORT AND RESOURCES: If Ms. Letsinger is interested in ATM-specific information and support, there are two groups, Facing Our Risk (www.facingourrisk.com) and Bright Pink (www.brightpink.org) which some people have found useful. They  provide opportunities to speak with other individuals from high-risk families. To locate genetic counselors in other cities, visit the website of the [employer] ([employer].nl) and Financial controller for a Veterinary surgeon by zip code.  We have recommended that she be seen in our high risk breast clinic.  She has asked for a referral to that clinic.  We encouraged Ms. Millikin to remain in contact with  [place] on an annual basis so we can update her personal and family histories, and let her know of advances in cancer genetics that may benefit the family. Our contact number was provided. Ms. Milani questions were answered to her satisfaction today, and she knows she is welcome to call anytime with additional questions.   Mikelle Myrick P. Lowell Guitar, MS, [facility] Licensed, Patent attorney [email or web address] phone: [phone]

## 2022-11-20 ENCOUNTER — Encounter: Payer: Self-pay | Admitting: Genetic Counselor

## 2022-12-21 ENCOUNTER — Inpatient Hospital Stay: Payer: BC Managed Care – PPO | Attending: Genetic Counselor | Admitting: Hematology and Oncology

## 2022-12-21 ENCOUNTER — Other Ambulatory Visit: Payer: Self-pay

## 2022-12-21 VITALS — BP 142/77 | HR 87 | Temp 98.1°F | Resp 16 | Wt 235.0 lb

## 2022-12-21 DIAGNOSIS — Z1509 Genetic susceptibility to other malignant neoplasm: Secondary | ICD-10-CM | POA: Diagnosis not present

## 2022-12-21 DIAGNOSIS — F17210 Nicotine dependence, cigarettes, uncomplicated: Secondary | ICD-10-CM | POA: Diagnosis not present

## 2022-12-21 DIAGNOSIS — Z1501 Genetic susceptibility to malignant neoplasm of breast: Secondary | ICD-10-CM | POA: Insufficient documentation

## 2022-12-21 DIAGNOSIS — E785 Hyperlipidemia, unspecified: Secondary | ICD-10-CM | POA: Diagnosis not present

## 2022-12-21 DIAGNOSIS — Z803 Family history of malignant neoplasm of breast: Secondary | ICD-10-CM | POA: Insufficient documentation

## 2022-12-21 DIAGNOSIS — D649 Anemia, unspecified: Secondary | ICD-10-CM | POA: Diagnosis not present

## 2022-12-21 NOTE — Progress Notes (Incomplete)
[facility] CONSULT NOTE  Patient Care Team: Pcp, No as PCP - General  CHIEF COMPLAINTS/PURPOSE OF CONSULTATION:  Newly diagnosed breast cancer  HISTORY OF PRESENTING ILLNESS:  Karen Goodman 27 y.o. female is here because of recent diagnosis of {left/right:311354}   I reviewed her records extensively and collaborated the history with the patient.  SUMMARY OF ONCOLOGIC HISTORY: Oncology History   No history exists.     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   Depression    tx'd at age 29 and eventually weaned off medication   Dysmenorrhea    Family history of breast cancer    Family history of uterine cancer    Genetic testing 11/19/2022   HSV-1 infection    Hyperlipidemia     SURGICAL HISTORY: Past Surgical History:  Procedure Laterality Date   CARPAL TUNNEL RELEASE Bilateral 01/2021   10./2022   TYMPANOSTOMY TUBE PLACEMENT     When she was a baby   WISDOM TOOTH EXTRACTION  05/11/2009    SOCIAL HISTORY: Social History   Socioeconomic History   Marital status: Significant Other    Spouse name: Not on file   Number of children: Not on file   Years of education: Not on file   Highest education level: Not on file  Occupational History   Not on file  Tobacco Use   Smoking status: Former    Current packs/day: 0.25    Average packs/day: 0.3 packs/day for 4.0 years (1.0 ttl pk-yrs)    Types: Cigarettes   Smokeless tobacco: Current   Tobacco comments:    2 packs/week  Vaping Use   Vaping status: Some Days   Substances: Nicotine  Substance and Sexual Activity   Alcohol use: Yes    Alcohol/week: 21.0 standard drinks of alcohol    Types: 21 Standard drinks or equivalent per week   Drug use: Not Currently    Comment: smokes marijuana rarely   Sexual activity: Yes    Partners: Male    Birth control/protection: I.U.D.    Comment:  [name]  Other Topics Concern   Not on file  Social History Narrative   Not on file   Social Determinants of Health   Financial Resource Strain: Not on file  Food Insecurity: Not on file  Transportation Needs: Not on file  Physical Activity: Not on file  Stress: Not on file  Social Connections: Not on file  Intimate Partner Violence: Not on file    FAMILY HISTORY: Family History  Problem Relation Age of Onset   Breast cancer Mother 47       ATM + stage 1, ductal, nonfiltrating-chemo, lumpectomy; second breast cancer at 62   Infertility Maternal Aunt    Breast cancer Maternal Aunt 57       ATM + ductal, dbl mastectomy-no further rx   Endometrial cancer Maternal Grandmother 72   Infertility Maternal Grandmother    Esophageal cancer Maternal Grandfather 66    ALLERGIES:  is allergic to bee venom.  MEDICATIONS:  Current Outpatient Medications  Medication Sig Dispense Refill   EPIPEN 2-PAK 0.3 MG/0.3ML SOAJ 0.3 mg.     levonorgestrel ([name]) 19.5 MG IUD by Intrauterine route once.     No current facility-administered medications for this visit.    REVIEW OF SYSTEMS:   Constitutional: Denies fevers, chills or  abnormal night sweats Breast: *** Denies any palpable lumps or discharge All other systems were reviewed with the patient and are negative.  PHYSICAL EXAMINATION: ECOG PERFORMANCE STATUS: {CHL ONC ECOG PX:[id number]}  Vitals:   12/21/22 1546  BP: (!) 142/77  Pulse: 87  Resp: 16  Temp: 98.1 F (36.7 C)  SpO2: 99%   Filed Weights   12/21/22 1546  Weight: 235 lb (106.6 kg)    GENERAL:alert, no distress and comfortable BREAST:*** No palpable nodules in breast. No palpable axillary or supraclavicular lymphadenopathy (exam performed in the presence of a chaperone)   LABORATORY DATA:  I have reviewed the data as listed Lab Results  Component Value Date   WBC 5.9 11/20/2020   HGB 14.1 11/20/2020   HCT 42.2 11/20/2020   MCV 92.3 11/20/2020   PLT 350  11/20/2020   Lab Results  Component Value Date   NA 141 11/20/2020   K 4.4 11/20/2020   CL 109 11/20/2020   CO2 23 11/20/2020    RADIOGRAPHIC STUDIES: I have personally reviewed the radiological reports and agreed with the findings in the report.  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 She does not have any family history of breast cancer hence believe her risk is generally between 20-30%. There is also risk of colon cancer and pancreatic cancer as well as prostate cancer in men. I recommended that she undergo colonoscopy every 5 years.  Ovarian cancer risk 2 to 3%  For breast cancer surveillance: I offered the patient 2 options. She can undergo breast reduction bilateral mastectomies or she can undergo active surveillance with mammogram and breast MRI every year. At this point patient chose to undergo active surveillance with mammograms and annual breast MRIs.     All questions were answered. The patient knows to call the clinic with any problems, questions or concerns.    Tamsen Meek, MD 12/21/22

## 2022-12-21 NOTE — Progress Notes (Unsigned)
[facility] CONSULT NOTE  Patient Care Team: Pcp, No as PCP - General  CHIEF COMPLAINTS/PURPOSE OF CONSULTATION:  Newly diagnosed breast cancer  HISTORY OF PRESENTING ILLNESS:  Karen Goodman 27 y.o. female is here because of recent diagnosis of {left/right:311354}   I reviewed her records extensively and collaborated the history with the patient.  SUMMARY OF ONCOLOGIC HISTORY: Oncology History   No history exists.     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   Depression    tx'd at age 29 and eventually weaned off medication   Dysmenorrhea    Family history of breast cancer    Family history of uterine cancer    Genetic testing 11/19/2022   HSV-1 infection    Hyperlipidemia     SURGICAL HISTORY: Past Surgical History:  Procedure Laterality Date   CARPAL TUNNEL RELEASE Bilateral 01/2021   10./2022   TYMPANOSTOMY TUBE PLACEMENT     When she was a baby   WISDOM TOOTH EXTRACTION  05/11/2009    SOCIAL HISTORY: Social History   Socioeconomic History   Marital status: Significant Other    Spouse name: Not on file   Number of children: Not on file   Years of education: Not on file   Highest education level: Not on file  Occupational History   Not on file  Tobacco Use   Smoking status: Former    Current packs/day: 0.25    Average packs/day: 0.3 packs/day for 4.0 years (1.0 ttl pk-yrs)    Types: Cigarettes   Smokeless tobacco: Current   Tobacco comments:    2 packs/week  Vaping Use   Vaping status: Some Days   Substances: Nicotine  Substance and Sexual Activity   Alcohol use: Yes    Alcohol/week: 21.0 standard drinks of alcohol    Types: 21 Standard drinks or equivalent per week   Drug use: Not Currently    Comment: smokes marijuana rarely   Sexual activity: Yes    Partners: Male    Birth control/protection: I.U.D.    Comment:  [name]  Other Topics Concern   Not on file  Social History Narrative   Not on file   Social Determinants of Health   Financial Resource Strain: Not on file  Food Insecurity: Not on file  Transportation Needs: Not on file  Physical Activity: Not on file  Stress: Not on file  Social Connections: Not on file  Intimate Partner Violence: Not on file    FAMILY HISTORY: Family History  Problem Relation Age of Onset   Breast cancer Mother 47       ATM + stage 1, ductal, nonfiltrating-chemo, lumpectomy; second breast cancer at 62   Infertility Maternal Aunt    Breast cancer Maternal Aunt 57       ATM + ductal, dbl mastectomy-no further rx   Endometrial cancer Maternal Grandmother 72   Infertility Maternal Grandmother    Esophageal cancer Maternal Grandfather 66    ALLERGIES:  is allergic to bee venom.  MEDICATIONS:  Current Outpatient Medications  Medication Sig Dispense Refill   EPIPEN 2-PAK 0.3 MG/0.3ML SOAJ 0.3 mg.     levonorgestrel ([name]) 19.5 MG IUD by Intrauterine route once.     No current facility-administered medications for this visit.    REVIEW OF SYSTEMS:   Constitutional: Denies fevers, chills or  abnormal night sweats Breast: *** Denies any palpable lumps or discharge All other systems were reviewed with the patient and are negative.  PHYSICAL EXAMINATION: ECOG PERFORMANCE STATUS: {CHL ONC ECOG PX:[id number]}  Vitals:   12/21/22 1546  BP: (!) 142/77  Pulse: 87  Resp: 16  Temp: 98.1 F (36.7 C)  SpO2: 99%   Filed Weights   12/21/22 1546  Weight: 235 lb (106.6 kg)    GENERAL:alert, no distress and comfortable BREAST:*** No palpable nodules in breast. No palpable axillary or supraclavicular lymphadenopathy (exam performed in the presence of a chaperone)   LABORATORY DATA:  I have reviewed the data as listed Lab Results  Component Value Date   WBC 5.9 11/20/2020   HGB 14.1 11/20/2020   HCT 42.2 11/20/2020   MCV 92.3 11/20/2020   PLT 350  11/20/2020   Lab Results  Component Value Date   NA 141 11/20/2020   K 4.4 11/20/2020   CL 109 11/20/2020   CO2 23 11/20/2020    RADIOGRAPHIC STUDIES: I have personally reviewed the radiological reports and agreed with the findings in the report.  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 She does not have any family history of breast cancer hence believe her risk is generally between 20-30%. There is also risk of colon cancer and pancreatic cancer as well as prostate cancer in men. I recommended that she undergo colonoscopy every 5 years.  Ovarian cancer risk 2 to 3%  For breast cancer surveillance: I offered the patient 2 options. She can undergo breast reduction bilateral mastectomies or she can undergo active surveillance with mammogram and breast MRI every year. At this point patient chose to undergo active surveillance with mammograms and annual breast MRIs.     All questions were answered. The patient knows to call the clinic with any problems, questions or concerns.    Tamsen Meek, MD 12/21/22

## 2022-12-21 NOTE — Assessment & Plan Note (Addendum)
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 She does not have any family history of breast cancer hence believe her risk is generally between 20-30%. There is also risk of colon cancer and pancreatic cancer as well as prostate cancer in men. I recommended that she undergo colonoscopy every 5 years.  Ovarian cancer risk 2 to 3%  For breast cancer surveillance: I offered the patient 2 options. She can undergo breast reduction bilateral mastectomies or she can undergo active surveillance with mammogram and breast MRI every year. At this point patient chose to undergo active surveillance with mammograms and annual breast MRIs.

## 2022-12-23 NOTE — Progress Notes (Signed)
[facility] CONSULT NOTE  Patient Care Team: Pcp, No as PCP - General  CHIEF COMPLAINTS/PURPOSE OF CONSULTATION:  ATM gene mutation  HISTORY OF PRESENTING ILLNESS:  Karen Goodman 27 y.o. female is here because of recent diagnosis of ATM gene mutation.  She was referred to [place] for high risk breast clinic to discuss risk reduction as well as surveillance.  Patient has never had any prior breast problems.  She has 2 family members with breast cancer.  That was how the ATM mutation was detected in the family.  I reviewed her records extensively and collaborated the history with the patient.     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   Depression    tx'd at age 40 and eventually weaned off medication   Dysmenorrhea    Family history of breast cancer    Family history of uterine cancer    Genetic testing 11/19/2022   HSV-1 infection    Hyperlipidemia     SURGICAL HISTORY: Past Surgical History:  Procedure Laterality Date   CARPAL TUNNEL RELEASE Bilateral 01/2021   10./2022   TYMPANOSTOMY TUBE PLACEMENT     When she was a baby   WISDOM TOOTH EXTRACTION  05/11/2009    SOCIAL HISTORY: Social History   Socioeconomic History   Marital status: Significant Other    Spouse name: Not on file   Number of children: Not on file   Years of education: Not on file   Highest education level: Not on file  Occupational History   Not on file  Tobacco Use   Smoking status: Former    Current packs/day: 0.25    Average packs/day: 0.3 packs/day for 4.0 years (1.0 ttl pk-yrs)    Types: Cigarettes   Smokeless tobacco: Current   Tobacco comments:    2 packs/week  Vaping Use   Vaping status: Some Days   Substances: Nicotine  Substance and Sexual Activity   Alcohol use: Yes    Alcohol/week: 21.0 standard drinks of alcohol    Types: 21 Standard drinks or  equivalent per week   Drug use: Not Currently    Comment: smokes marijuana rarely   Sexual activity: Yes    Partners: Male    Birth control/protection: I.U.D.    Comment: [name]  Other Topics Concern   Not on file  Social History Narrative   Not on file   Social Determinants of Health   Financial Resource Strain: Not on file  Food Insecurity: Not on file  Transportation Needs: Not on file  Physical Activity: Not on file  Stress: Not on file  Social Connections: Not on file  Intimate Partner Violence: Not on file    FAMILY HISTORY: Family History  Problem Relation Age of Onset   Breast cancer Mother 5       ATM + stage 1, ductal, nonfiltrating-chemo, lumpectomy; second breast cancer at 42   Infertility Maternal Aunt    Breast cancer Maternal Aunt 75       ATM + ductal, dbl mastectomy-no further rx   Endometrial cancer Maternal Grandmother 72   Infertility Maternal Grandmother    Esophageal cancer Maternal Grandfather 30    ALLERGIES:  is allergic to bee venom.  MEDICATIONS:  Current Outpatient Medications  Medication Sig Dispense Refill   EPIPEN 2-PAK 0.3 MG/0.3ML SOAJ 0.3 mg.  levonorgestrel ([name]) 19.5 MG IUD by Intrauterine route once.     No current facility-administered medications for this visit.    REVIEW OF SYSTEMS:   Constitutional: Denies fevers, chills or abnormal night sweats Breast:  Denies any palpable lumps or discharge All other systems were reviewed with the patient and are negative.  PHYSICAL EXAMINATION: ECOG PERFORMANCE STATUS: 0 - Asymptomatic  Vitals:   12/21/22 1546  BP: (!) 142/77  Pulse: 87  Resp: 16  Temp: 98.1 F (36.7 C)  SpO2: 99%   Filed Weights   12/21/22 1546  Weight: 235 lb (106.6 kg)    GENERAL:alert, no distress and comfortable    LABORATORY DATA:  I have reviewed the data as listed Lab Results  Component Value Date   WBC 5.9 11/20/2020   HGB 14.1 11/20/2020   HCT 42.2 11/20/2020   MCV 92.3  11/20/2020   PLT 350 11/20/2020   Lab Results  Component Value Date   NA 141 11/20/2020   K 4.4 11/20/2020   CL 109 11/20/2020   CO2 23 11/20/2020    RADIOGRAPHIC STUDIES: I have personally reviewed the radiological reports and agreed with the findings in the report.  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 She does not have any family history of breast cancer hence believe her risk is generally between 20-30%. There is also risk of colon cancer and pancreatic cancer as well as prostate cancer in men. I recommended that she undergo colonoscopy every 5 years.  Ovarian cancer risk 2 to 3%  For breast cancer surveillance: I offered the patient 2 options. She can undergo breast reduction bilateral mastectomies or she can undergo active surveillance with mammogram and breast MRI every year. At this point patient chose to undergo active surveillance with mammograms and annual breast MRIs.     All questions were answered. The patient knows to call the clinic with any problems, questions or concerns.    Tamsen Meek, MD 12/23/22

## 2022-12-23 NOTE — Progress Notes (Deleted)
This encounter was created in error - please disregard.

## 2023-02-01 DIAGNOSIS — M9902 Segmental and somatic dysfunction of thoracic region: Secondary | ICD-10-CM | POA: Diagnosis not present

## 2023-02-01 DIAGNOSIS — M9901 Segmental and somatic dysfunction of cervical region: Secondary | ICD-10-CM | POA: Diagnosis not present

## 2023-02-01 DIAGNOSIS — M9903 Segmental and somatic dysfunction of lumbar region: Secondary | ICD-10-CM | POA: Diagnosis not present

## 2023-02-01 DIAGNOSIS — M9905 Segmental and somatic dysfunction of pelvic region: Secondary | ICD-10-CM | POA: Diagnosis not present

## 2023-02-03 DIAGNOSIS — M9902 Segmental and somatic dysfunction of thoracic region: Secondary | ICD-10-CM | POA: Diagnosis not present

## 2023-02-03 DIAGNOSIS — M9903 Segmental and somatic dysfunction of lumbar region: Secondary | ICD-10-CM | POA: Diagnosis not present

## 2023-02-03 DIAGNOSIS — M9905 Segmental and somatic dysfunction of pelvic region: Secondary | ICD-10-CM | POA: Diagnosis not present

## 2023-02-03 DIAGNOSIS — M9901 Segmental and somatic dysfunction of cervical region: Secondary | ICD-10-CM | POA: Diagnosis not present

## 2023-02-05 DIAGNOSIS — M9902 Segmental and somatic dysfunction of thoracic region: Secondary | ICD-10-CM | POA: Diagnosis not present

## 2023-02-05 DIAGNOSIS — M9901 Segmental and somatic dysfunction of cervical region: Secondary | ICD-10-CM | POA: Diagnosis not present

## 2023-02-05 DIAGNOSIS — M9905 Segmental and somatic dysfunction of pelvic region: Secondary | ICD-10-CM | POA: Diagnosis not present

## 2023-02-05 DIAGNOSIS — M9903 Segmental and somatic dysfunction of lumbar region: Secondary | ICD-10-CM | POA: Diagnosis not present

## 2023-02-08 DIAGNOSIS — M9905 Segmental and somatic dysfunction of pelvic region: Secondary | ICD-10-CM | POA: Diagnosis not present

## 2023-02-08 DIAGNOSIS — M9901 Segmental and somatic dysfunction of cervical region: Secondary | ICD-10-CM | POA: Diagnosis not present

## 2023-02-08 DIAGNOSIS — M9903 Segmental and somatic dysfunction of lumbar region: Secondary | ICD-10-CM | POA: Diagnosis not present

## 2023-02-08 DIAGNOSIS — M9902 Segmental and somatic dysfunction of thoracic region: Secondary | ICD-10-CM | POA: Diagnosis not present

## 2023-02-10 DIAGNOSIS — M9905 Segmental and somatic dysfunction of pelvic region: Secondary | ICD-10-CM | POA: Diagnosis not present

## 2023-02-10 DIAGNOSIS — M9902 Segmental and somatic dysfunction of thoracic region: Secondary | ICD-10-CM | POA: Diagnosis not present

## 2023-02-10 DIAGNOSIS — M9903 Segmental and somatic dysfunction of lumbar region: Secondary | ICD-10-CM | POA: Diagnosis not present

## 2023-02-10 DIAGNOSIS — M9901 Segmental and somatic dysfunction of cervical region: Secondary | ICD-10-CM | POA: Diagnosis not present

## 2023-02-11 DIAGNOSIS — M9901 Segmental and somatic dysfunction of cervical region: Secondary | ICD-10-CM | POA: Diagnosis not present

## 2023-02-11 DIAGNOSIS — M9903 Segmental and somatic dysfunction of lumbar region: Secondary | ICD-10-CM | POA: Diagnosis not present

## 2023-02-11 DIAGNOSIS — M9905 Segmental and somatic dysfunction of pelvic region: Secondary | ICD-10-CM | POA: Diagnosis not present

## 2023-02-11 DIAGNOSIS — M9902 Segmental and somatic dysfunction of thoracic region: Secondary | ICD-10-CM | POA: Diagnosis not present

## 2023-02-12 DIAGNOSIS — M9903 Segmental and somatic dysfunction of lumbar region: Secondary | ICD-10-CM | POA: Diagnosis not present

## 2023-02-12 DIAGNOSIS — M9901 Segmental and somatic dysfunction of cervical region: Secondary | ICD-10-CM | POA: Diagnosis not present

## 2023-02-12 DIAGNOSIS — M9902 Segmental and somatic dysfunction of thoracic region: Secondary | ICD-10-CM | POA: Diagnosis not present

## 2023-02-12 DIAGNOSIS — M9905 Segmental and somatic dysfunction of pelvic region: Secondary | ICD-10-CM | POA: Diagnosis not present

## 2023-02-15 DIAGNOSIS — M9905 Segmental and somatic dysfunction of pelvic region: Secondary | ICD-10-CM | POA: Diagnosis not present

## 2023-02-15 DIAGNOSIS — M9903 Segmental and somatic dysfunction of lumbar region: Secondary | ICD-10-CM | POA: Diagnosis not present

## 2023-02-15 DIAGNOSIS — M9901 Segmental and somatic dysfunction of cervical region: Secondary | ICD-10-CM | POA: Diagnosis not present

## 2023-02-15 DIAGNOSIS — M9902 Segmental and somatic dysfunction of thoracic region: Secondary | ICD-10-CM | POA: Diagnosis not present

## 2023-02-16 DIAGNOSIS — M9905 Segmental and somatic dysfunction of pelvic region: Secondary | ICD-10-CM | POA: Diagnosis not present

## 2023-02-16 DIAGNOSIS — M9902 Segmental and somatic dysfunction of thoracic region: Secondary | ICD-10-CM | POA: Diagnosis not present

## 2023-02-16 DIAGNOSIS — M9903 Segmental and somatic dysfunction of lumbar region: Secondary | ICD-10-CM | POA: Diagnosis not present

## 2023-02-16 DIAGNOSIS — M9901 Segmental and somatic dysfunction of cervical region: Secondary | ICD-10-CM | POA: Diagnosis not present

## 2023-02-17 DIAGNOSIS — M9905 Segmental and somatic dysfunction of pelvic region: Secondary | ICD-10-CM | POA: Diagnosis not present

## 2023-02-17 DIAGNOSIS — M9902 Segmental and somatic dysfunction of thoracic region: Secondary | ICD-10-CM | POA: Diagnosis not present

## 2023-02-17 DIAGNOSIS — M9903 Segmental and somatic dysfunction of lumbar region: Secondary | ICD-10-CM | POA: Diagnosis not present

## 2023-02-17 DIAGNOSIS — M9901 Segmental and somatic dysfunction of cervical region: Secondary | ICD-10-CM | POA: Diagnosis not present

## 2023-02-22 DIAGNOSIS — M9903 Segmental and somatic dysfunction of lumbar region: Secondary | ICD-10-CM | POA: Diagnosis not present

## 2023-02-22 DIAGNOSIS — M9905 Segmental and somatic dysfunction of pelvic region: Secondary | ICD-10-CM | POA: Diagnosis not present

## 2023-02-22 DIAGNOSIS — M9902 Segmental and somatic dysfunction of thoracic region: Secondary | ICD-10-CM | POA: Diagnosis not present

## 2023-02-22 DIAGNOSIS — M9901 Segmental and somatic dysfunction of cervical region: Secondary | ICD-10-CM | POA: Diagnosis not present

## 2023-02-23 ENCOUNTER — Ambulatory Visit: Payer: BC Managed Care – PPO | Admitting: Neurology

## 2023-02-24 DIAGNOSIS — M9901 Segmental and somatic dysfunction of cervical region: Secondary | ICD-10-CM | POA: Diagnosis not present

## 2023-02-24 DIAGNOSIS — M9903 Segmental and somatic dysfunction of lumbar region: Secondary | ICD-10-CM | POA: Diagnosis not present

## 2023-02-24 DIAGNOSIS — M9902 Segmental and somatic dysfunction of thoracic region: Secondary | ICD-10-CM | POA: Diagnosis not present

## 2023-02-24 DIAGNOSIS — M9905 Segmental and somatic dysfunction of pelvic region: Secondary | ICD-10-CM | POA: Diagnosis not present

## 2023-02-26 DIAGNOSIS — M9903 Segmental and somatic dysfunction of lumbar region: Secondary | ICD-10-CM | POA: Diagnosis not present

## 2023-02-26 DIAGNOSIS — M9901 Segmental and somatic dysfunction of cervical region: Secondary | ICD-10-CM | POA: Diagnosis not present

## 2023-02-26 DIAGNOSIS — M9902 Segmental and somatic dysfunction of thoracic region: Secondary | ICD-10-CM | POA: Diagnosis not present

## 2023-02-26 DIAGNOSIS — M9905 Segmental and somatic dysfunction of pelvic region: Secondary | ICD-10-CM | POA: Diagnosis not present

## 2023-03-01 DIAGNOSIS — M9902 Segmental and somatic dysfunction of thoracic region: Secondary | ICD-10-CM | POA: Diagnosis not present

## 2023-03-01 DIAGNOSIS — M9901 Segmental and somatic dysfunction of cervical region: Secondary | ICD-10-CM | POA: Diagnosis not present

## 2023-03-01 DIAGNOSIS — M9903 Segmental and somatic dysfunction of lumbar region: Secondary | ICD-10-CM | POA: Diagnosis not present

## 2023-03-01 DIAGNOSIS — M9905 Segmental and somatic dysfunction of pelvic region: Secondary | ICD-10-CM | POA: Diagnosis not present

## 2023-03-03 DIAGNOSIS — M9901 Segmental and somatic dysfunction of cervical region: Secondary | ICD-10-CM | POA: Diagnosis not present

## 2023-03-03 DIAGNOSIS — M9902 Segmental and somatic dysfunction of thoracic region: Secondary | ICD-10-CM | POA: Diagnosis not present

## 2023-03-03 DIAGNOSIS — M9905 Segmental and somatic dysfunction of pelvic region: Secondary | ICD-10-CM | POA: Diagnosis not present

## 2023-03-03 DIAGNOSIS — M9903 Segmental and somatic dysfunction of lumbar region: Secondary | ICD-10-CM | POA: Diagnosis not present

## 2023-03-10 DIAGNOSIS — M9903 Segmental and somatic dysfunction of lumbar region: Secondary | ICD-10-CM | POA: Diagnosis not present

## 2023-03-10 DIAGNOSIS — M9902 Segmental and somatic dysfunction of thoracic region: Secondary | ICD-10-CM | POA: Diagnosis not present

## 2023-03-10 DIAGNOSIS — M9905 Segmental and somatic dysfunction of pelvic region: Secondary | ICD-10-CM | POA: Diagnosis not present

## 2023-03-10 DIAGNOSIS — M9901 Segmental and somatic dysfunction of cervical region: Secondary | ICD-10-CM | POA: Diagnosis not present

## 2023-03-11 DIAGNOSIS — M9905 Segmental and somatic dysfunction of pelvic region: Secondary | ICD-10-CM | POA: Diagnosis not present

## 2023-03-11 DIAGNOSIS — M9902 Segmental and somatic dysfunction of thoracic region: Secondary | ICD-10-CM | POA: Diagnosis not present

## 2023-03-11 DIAGNOSIS — M9901 Segmental and somatic dysfunction of cervical region: Secondary | ICD-10-CM | POA: Diagnosis not present

## 2023-03-11 DIAGNOSIS — M9903 Segmental and somatic dysfunction of lumbar region: Secondary | ICD-10-CM | POA: Diagnosis not present

## 2023-03-15 DIAGNOSIS — M9901 Segmental and somatic dysfunction of cervical region: Secondary | ICD-10-CM | POA: Diagnosis not present

## 2023-03-15 DIAGNOSIS — M9905 Segmental and somatic dysfunction of pelvic region: Secondary | ICD-10-CM | POA: Diagnosis not present

## 2023-03-15 DIAGNOSIS — M9902 Segmental and somatic dysfunction of thoracic region: Secondary | ICD-10-CM | POA: Diagnosis not present

## 2023-03-15 DIAGNOSIS — M9903 Segmental and somatic dysfunction of lumbar region: Secondary | ICD-10-CM | POA: Diagnosis not present

## 2023-03-17 DIAGNOSIS — M9903 Segmental and somatic dysfunction of lumbar region: Secondary | ICD-10-CM | POA: Diagnosis not present

## 2023-03-17 DIAGNOSIS — M9901 Segmental and somatic dysfunction of cervical region: Secondary | ICD-10-CM | POA: Diagnosis not present

## 2023-03-17 DIAGNOSIS — M9902 Segmental and somatic dysfunction of thoracic region: Secondary | ICD-10-CM | POA: Diagnosis not present

## 2023-03-17 DIAGNOSIS — M9905 Segmental and somatic dysfunction of pelvic region: Secondary | ICD-10-CM | POA: Diagnosis not present

## 2023-03-22 DIAGNOSIS — M9905 Segmental and somatic dysfunction of pelvic region: Secondary | ICD-10-CM | POA: Diagnosis not present

## 2023-03-22 DIAGNOSIS — M9903 Segmental and somatic dysfunction of lumbar region: Secondary | ICD-10-CM | POA: Diagnosis not present

## 2023-03-22 DIAGNOSIS — M9902 Segmental and somatic dysfunction of thoracic region: Secondary | ICD-10-CM | POA: Diagnosis not present

## 2023-03-22 DIAGNOSIS — M9901 Segmental and somatic dysfunction of cervical region: Secondary | ICD-10-CM | POA: Diagnosis not present

## 2023-03-24 DIAGNOSIS — M9905 Segmental and somatic dysfunction of pelvic region: Secondary | ICD-10-CM | POA: Diagnosis not present

## 2023-03-24 DIAGNOSIS — M9903 Segmental and somatic dysfunction of lumbar region: Secondary | ICD-10-CM | POA: Diagnosis not present

## 2023-03-24 DIAGNOSIS — M9901 Segmental and somatic dysfunction of cervical region: Secondary | ICD-10-CM | POA: Diagnosis not present

## 2023-03-24 DIAGNOSIS — M9902 Segmental and somatic dysfunction of thoracic region: Secondary | ICD-10-CM | POA: Diagnosis not present

## 2023-06-23 ENCOUNTER — Ambulatory Visit (HOSPITAL_COMMUNITY): Payer: BC Managed Care – PPO

## 2023-06-23 ENCOUNTER — Encounter (HOSPITAL_COMMUNITY): Payer: Self-pay

## 2023-06-30 ENCOUNTER — Encounter: Payer: Self-pay | Admitting: Genetic Counselor

## 2023-12-21 ENCOUNTER — Inpatient Hospital Stay: Payer: BC Managed Care – PPO | Admitting: Hematology and Oncology

## 2024-01-17 DIAGNOSIS — M25571 Pain in right ankle and joints of right foot: Secondary | ICD-10-CM | POA: Diagnosis not present

## 2024-01-17 DIAGNOSIS — M25572 Pain in left ankle and joints of left foot: Secondary | ICD-10-CM | POA: Diagnosis not present

## 2024-02-07 DIAGNOSIS — M25571 Pain in right ankle and joints of right foot: Secondary | ICD-10-CM | POA: Diagnosis not present

## 2024-02-07 DIAGNOSIS — M25572 Pain in left ankle and joints of left foot: Secondary | ICD-10-CM | POA: Diagnosis not present

## 2024-03-13 DIAGNOSIS — R262 Difficulty in walking, not elsewhere classified: Secondary | ICD-10-CM | POA: Diagnosis not present

## 2024-03-13 DIAGNOSIS — M25579 Pain in unspecified ankle and joints of unspecified foot: Secondary | ICD-10-CM | POA: Diagnosis not present

## 2024-03-13 DIAGNOSIS — M6281 Muscle weakness (generalized): Secondary | ICD-10-CM | POA: Diagnosis not present

## 2024-03-22 DIAGNOSIS — R262 Difficulty in walking, not elsewhere classified: Secondary | ICD-10-CM | POA: Diagnosis not present

## 2024-03-22 DIAGNOSIS — M6281 Muscle weakness (generalized): Secondary | ICD-10-CM | POA: Diagnosis not present

## 2024-03-22 DIAGNOSIS — M25579 Pain in unspecified ankle and joints of unspecified foot: Secondary | ICD-10-CM | POA: Diagnosis not present

## 2024-03-29 DIAGNOSIS — M6281 Muscle weakness (generalized): Secondary | ICD-10-CM | POA: Diagnosis not present

## 2024-03-29 DIAGNOSIS — M25579 Pain in unspecified ankle and joints of unspecified foot: Secondary | ICD-10-CM | POA: Diagnosis not present

## 2024-03-29 DIAGNOSIS — R262 Difficulty in walking, not elsewhere classified: Secondary | ICD-10-CM | POA: Diagnosis not present

## 2024-04-03 DIAGNOSIS — R262 Difficulty in walking, not elsewhere classified: Secondary | ICD-10-CM | POA: Diagnosis not present

## 2024-04-03 DIAGNOSIS — M25579 Pain in unspecified ankle and joints of unspecified foot: Secondary | ICD-10-CM | POA: Diagnosis not present

## 2024-04-03 DIAGNOSIS — M6281 Muscle weakness (generalized): Secondary | ICD-10-CM | POA: Diagnosis not present

## 2024-04-12 DIAGNOSIS — R262 Difficulty in walking, not elsewhere classified: Secondary | ICD-10-CM | POA: Diagnosis not present

## 2024-04-12 DIAGNOSIS — M6281 Muscle weakness (generalized): Secondary | ICD-10-CM | POA: Diagnosis not present

## 2024-04-12 DIAGNOSIS — M25579 Pain in unspecified ankle and joints of unspecified foot: Secondary | ICD-10-CM | POA: Diagnosis not present

## 2024-04-19 DIAGNOSIS — M6281 Muscle weakness (generalized): Secondary | ICD-10-CM | POA: Diagnosis not present

## 2024-04-19 DIAGNOSIS — R262 Difficulty in walking, not elsewhere classified: Secondary | ICD-10-CM | POA: Diagnosis not present

## 2024-04-19 DIAGNOSIS — M25579 Pain in unspecified ankle and joints of unspecified foot: Secondary | ICD-10-CM | POA: Diagnosis not present

## 2024-04-26 DIAGNOSIS — M25579 Pain in unspecified ankle and joints of unspecified foot: Secondary | ICD-10-CM | POA: Diagnosis not present

## 2024-04-26 DIAGNOSIS — R262 Difficulty in walking, not elsewhere classified: Secondary | ICD-10-CM | POA: Diagnosis not present

## 2024-04-26 DIAGNOSIS — M6281 Muscle weakness (generalized): Secondary | ICD-10-CM | POA: Diagnosis not present
